# Patient Record
Sex: Male | Born: 1960
Health system: Southern US, Community
[De-identification: ages and names within clinical notes are randomized; demographics above are authoritative.]

## PROBLEM LIST (undated history)

## (undated) HISTORY — PX: KNEE ARTHROSCOPY: SHX127

---

## 2001-06-27 DIAGNOSIS — E7801 Familial hypercholesterolemia: Secondary | ICD-10-CM | POA: Insufficient documentation

## 2007-02-21 DIAGNOSIS — B009 Herpesviral infection, unspecified: Secondary | ICD-10-CM | POA: Insufficient documentation

## 2009-12-03 ENCOUNTER — Ambulatory Visit: Payer: Self-pay | Admitting: Family Medicine

## 2013-12-13 LAB — HM HEPATITIS C SCREENING LAB: HM Hepatitis Screen: NEGATIVE

## 2014-01-31 ENCOUNTER — Ambulatory Visit: Payer: Self-pay | Admitting: Gastroenterology

## 2014-01-31 LAB — HM COLONOSCOPY

## 2015-01-07 ENCOUNTER — Other Ambulatory Visit: Payer: Self-pay | Admitting: Family Medicine

## 2015-01-07 DIAGNOSIS — E785 Hyperlipidemia, unspecified: Secondary | ICD-10-CM | POA: Insufficient documentation

## 2015-01-07 NOTE — Telephone Encounter (Signed)
Last OV 02/2014  Thanks,   -Gawain Crombie  

## 2015-01-21 ENCOUNTER — Telehealth: Payer: Self-pay | Admitting: Family Medicine

## 2016-02-03 NOTE — Telephone Encounter (Signed)
error 

## 2016-08-29 DIAGNOSIS — S83221A Peripheral tear of medial meniscus, current injury, right knee, initial encounter: Secondary | ICD-10-CM | POA: Diagnosis not present

## 2016-08-31 ENCOUNTER — Other Ambulatory Visit: Payer: Self-pay | Admitting: Orthopaedic Surgery

## 2016-08-31 DIAGNOSIS — S83221A Peripheral tear of medial meniscus, current injury, right knee, initial encounter: Secondary | ICD-10-CM

## 2016-08-31 DIAGNOSIS — M25561 Pain in right knee: Secondary | ICD-10-CM

## 2016-09-02 ENCOUNTER — Ambulatory Visit
Admission: RE | Admit: 2016-09-02 | Discharge: 2016-09-02 | Disposition: A | Discharge status: Home / Self Care | Payer: 59 | Source: Ambulatory Visit | Attending: Orthopaedic Surgery | Admitting: Orthopaedic Surgery

## 2016-09-15 ENCOUNTER — Ambulatory Visit
Admission: RE | Admit: 2016-09-15 | Discharge: 2016-09-15 | Disposition: A | Discharge status: Home / Self Care | Payer: Commercial Managed Care - HMO | Source: Ambulatory Visit | Attending: Orthopaedic Surgery | Admitting: Orthopaedic Surgery

## 2016-09-15 DIAGNOSIS — S83241A Other tear of medial meniscus, current injury, right knee, initial encounter: Secondary | ICD-10-CM | POA: Diagnosis not present

## 2016-09-15 DIAGNOSIS — M25561 Pain in right knee: Secondary | ICD-10-CM | POA: Diagnosis not present

## 2016-09-15 DIAGNOSIS — X58XXXA Exposure to other specified factors, initial encounter: Secondary | ICD-10-CM | POA: Insufficient documentation

## 2016-09-15 DIAGNOSIS — R609 Edema, unspecified: Secondary | ICD-10-CM | POA: Insufficient documentation

## 2016-09-15 DIAGNOSIS — S83221A Peripheral tear of medial meniscus, current injury, right knee, initial encounter: Secondary | ICD-10-CM

## 2016-10-03 DIAGNOSIS — M2241 Chondromalacia patellae, right knee: Secondary | ICD-10-CM | POA: Diagnosis not present

## 2016-10-03 DIAGNOSIS — S83221A Peripheral tear of medial meniscus, current injury, right knee, initial encounter: Secondary | ICD-10-CM | POA: Diagnosis not present

## 2016-10-06 DIAGNOSIS — M94261 Chondromalacia, right knee: Secondary | ICD-10-CM | POA: Diagnosis not present

## 2016-10-06 DIAGNOSIS — M65861 Other synovitis and tenosynovitis, right lower leg: Secondary | ICD-10-CM | POA: Diagnosis not present

## 2016-10-06 DIAGNOSIS — S83231A Complex tear of medial meniscus, current injury, right knee, initial encounter: Secondary | ICD-10-CM | POA: Diagnosis not present

## 2016-10-06 DIAGNOSIS — M6588 Other synovitis and tenosynovitis, other site: Secondary | ICD-10-CM | POA: Diagnosis not present

## 2016-10-06 DIAGNOSIS — S83206A Unspecified tear of unspecified meniscus, current injury, right knee, initial encounter: Secondary | ICD-10-CM | POA: Diagnosis not present

## 2016-10-17 DIAGNOSIS — M25561 Pain in right knee: Secondary | ICD-10-CM | POA: Diagnosis not present

## 2016-10-24 DIAGNOSIS — M25561 Pain in right knee: Secondary | ICD-10-CM | POA: Diagnosis not present

## 2016-10-26 DIAGNOSIS — M25561 Pain in right knee: Secondary | ICD-10-CM | POA: Diagnosis not present

## 2017-04-14 ENCOUNTER — Ambulatory Visit: Payer: Self-pay | Admitting: Physician Assistant

## 2017-04-27 ENCOUNTER — Encounter: Payer: Self-pay | Admitting: Physician Assistant

## 2017-04-27 ENCOUNTER — Ambulatory Visit (INDEPENDENT_AMBULATORY_CARE_PROVIDER_SITE_OTHER): Payer: 59 | Admitting: Physician Assistant

## 2017-04-27 VITALS — BP 114/68 | HR 64 | Temp 97.8°F | Resp 16 | Ht 72.0 in | Wt 232.0 lb

## 2017-04-27 DIAGNOSIS — Z1329 Encounter for screening for other suspected endocrine disorder: Secondary | ICD-10-CM

## 2017-04-27 DIAGNOSIS — Z23 Encounter for immunization: Secondary | ICD-10-CM | POA: Diagnosis not present

## 2017-04-27 DIAGNOSIS — Z131 Encounter for screening for diabetes mellitus: Secondary | ICD-10-CM | POA: Diagnosis not present

## 2017-04-27 DIAGNOSIS — S83206A Unspecified tear of unspecified meniscus, current injury, right knee, initial encounter: Secondary | ICD-10-CM | POA: Diagnosis not present

## 2017-04-27 DIAGNOSIS — R1310 Dysphagia, unspecified: Secondary | ICD-10-CM

## 2017-04-27 DIAGNOSIS — D126 Benign neoplasm of colon, unspecified: Secondary | ICD-10-CM

## 2017-04-27 DIAGNOSIS — Z0000 Encounter for general adult medical examination without abnormal findings: Secondary | ICD-10-CM | POA: Diagnosis not present

## 2017-04-27 DIAGNOSIS — R0789 Other chest pain: Secondary | ICD-10-CM | POA: Diagnosis not present

## 2017-04-27 DIAGNOSIS — E785 Hyperlipidemia, unspecified: Secondary | ICD-10-CM

## 2017-04-27 NOTE — Patient Instructions (Signed)

## 2017-04-27 NOTE — Progress Notes (Signed)
Patient: Jose Khan, Male    DOB: Dec 13, 1960, 56 y.o.   MRN: [id number] Visit Date: 04/27/2017  Today's Provider: Trinna Post, PA-C   Chief Complaint  Patient presents with  . Establish Care  . Annual Exam   Subjective:    Annual physical exam Jose Khan is a 56 y.o. male who presents today for health maintenance and complete physical. He feels fairly well. He reports not exercising. He reports he is sleeping fairly well.  Pt says he travels a lot and does not sleep well.   Third marriage, 7 children. Works for [employer] for the past 20 years. Travels a lot.  Previously seen at this clinic, but not for three years. Previously on pravastatin for HLD.   Has recently had right knee surgery for torn meniscus, 8 months ago.  Surgery performed by Dr. Julian Hy at [facility]. He is having pain when he walks and would like it to be looked at by another orthopedist.   He reports he has a pain in his right chest area when he breathes on occasion. No sternal pain, notices it when he moves his arm.   He also reports that he has been having breathlessness with exertion. He used to be an avid runner, running 6-7 miles per day. Since his knee surgery 8 months ago he stopped running. He has noticed these symptoms after stopping running. Denies chest pain.   He also reports occasional trouble swallowing. He says especially when he eats breads or meats, he feels the food moves down slowly and he must drink a lot of water.   -----------------------------------------------------------------   Review of Systems  Constitutional: Negative.   HENT: Negative.   Eyes: Negative.   Respiratory: Negative.   Cardiovascular: Negative.   Gastrointestinal: Negative.   Endocrine: Negative.   Genitourinary: Negative.   Musculoskeletal: Negative.   Skin: Negative.   Allergic/Immunologic: Negative.   Neurological: Negative.   Hematological: Negative.     Psychiatric/Behavioral: Negative.     Social History      He  reports that he has never smoked. He has never used smokeless tobacco. He reports that he drinks about 9.0 oz of alcohol per week . He reports that he does not use drugs.       Social History   Social History  . Marital status: Married    Spouse name: N/A  . Number of children: N/A  . Years of education: N/A   Social History Main Topics  . Smoking status: Never Smoker  . Smokeless tobacco: Never Used  . Alcohol use 9.0 oz/week    7 Glasses of wine, 1 Cans of beer, 7 Shots of liquor per week  . Drug use: No  . Sexual activity: Not Asked   Other Topics Concern  . None   Social History Narrative  . None    No past medical history on file.   Patient Active Problem List   Diagnosis Date Noted  . Hyperlipemia 01/07/2015    Past Surgical History:  Procedure Laterality Date  . KNEE ARTHROSCOPY Right     Family History        Family Status  Relation Status  . Mother Alive  . Father Alive  . MGM (Not Specified)        His family history includes Breast cancer in his mother; CAD in his father; Dementia in his father and maternal grandmother; Macular degeneration in his mother;  Parkinson's disease in his mother.     No Known Allergies  No current outpatient prescriptions on file.   Patient Care Team: Paulene Floor as PCP - General (Physician Assistant)      Objective:   Vitals: BP 114/68 (BP Location: Right Arm, Patient Position: Sitting, Cuff Size: Large)   Pulse 64   Temp 97.8 F (36.6 C) (Oral)   Resp 16   Ht 6' (1.829 m)   Wt 232 lb (105.2 kg)   BMI 31.46 kg/m    Vitals:   04/27/17 0912  BP: 114/68  Pulse: 64  Resp: 16  Temp: 97.8 F (36.6 C)  TempSrc: Oral  Weight: 232 lb (105.2 kg)  Height: 6' (1.829 m)     Physical Exam  Constitutional: He is oriented to person, place, and time. He appears well-nourished.  HENT:  Right Ear: Tympanic membrane and external ear  normal.  Left Ear: Tympanic membrane and external ear normal.  Mouth/Throat: Oropharynx is clear and moist. No oropharyngeal exudate.  Eyes: Conjunctivae are normal.  Neck: Neck supple. Carotid bruit is not present. No thyromegaly present.  Cardiovascular: Normal rate, regular rhythm and normal heart sounds.   Pulmonary/Chest: Effort normal and breath sounds normal. He exhibits no tenderness.  Abdominal: Soft. Bowel sounds are normal.  Lymphadenopathy:    He has no cervical adenopathy.  Neurological: He is alert and oriented to person, place, and time.  Skin: Skin is warm and dry.  Psychiatric: He has a normal mood and affect. His behavior is normal.     Depression Screen PHQ 2/9 Scores 04/27/2017 04/27/2017  PHQ - 2 Score 0 0  PHQ- 9 Score - 1      Assessment & Plan:     Routine Health Maintenance and Physical Exam  Exercise Activities and Dietary recommendations Goals    None      Immunization History  Administered Date(s) Administered  . Influenza,inj,Quad PF,6+ Mos 04/27/2017  . Td 04/27/2017    Health Maintenance  Topic Date Due  . Hepatitis C Screening  1960/12/16  . HIV Screening  09/11/1977  . TETANUS/TDAP  09/11/1981  . COLONOSCOPY  09/11/2012  . INFLUENZA VACCINE  01/25/2017     Discussed health benefits of physical activity, and encouraged him to engage in regular exercise appropriate for his age and condition.    1. Annual physical exam  F/u one year for CPE, 6 mo if we start statin.  - CBC with Differential  2. Tear of meniscus of right knee, unspecified meniscus, unspecified tear type, unspecified whether old or current tear  - Ambulatory referral to Orthopedics  3. Hyperlipidemia, unspecified hyperlipidemia type  Will likely need to start statin again. Pt expresses dissatisfaction with muscle recovery when on statin previously. May try CoQ10.  - Lipid Profile  4. Diabetes mellitus screening  - Comprehensive Metabolic Panel  (CMET)  5. Thyroid disorder screening  - TSH  6. Need for Tdap vaccination   7. Need for Td vaccine  - Td : Tetanus/diphtheria >7yo Preservative  free  8. Flu vaccine need  - Flu Vaccine QUAD 6+ mos PF IM (Fluarix Quad PF)  9. Dysphagia, unspecified type  Possible ring or esophageal web. Would need GI referral if he wants to pursue further. Patient says he will wait right now.   10. Other chest pain  Think this is MSK. Also think breathlessness is due to deconditioning.  11. Tubular adenoma of colon  2015 colonoscopy with two tubular adenomas. Repeat  2020.  No Follow-up on file.  The entirety of the information documented in the History of Present Illness, Review of Systems and Physical Exam were personally obtained by me. Portions of this information were initially documented by Ashley Royalty, CMA and reviewed by me for thoroughness and accuracy.    --------------------------------------------------------------------    Trinna Post, PA-C  [facility]

## 2017-04-28 LAB — CBC WITH DIFFERENTIAL/PLATELET
Basophils Absolute: 22 cells/uL (ref 0–200)
Basophils Relative: 0.5 %
Eosinophils Absolute: 79 cells/uL (ref 15–500)
Eosinophils Relative: 1.8 %
HCT: 42 % (ref 38.5–50.0)
Hemoglobin: 15 g/dL (ref 13.2–17.1)
Lymphs Abs: 1492 cells/uL (ref 850–3900)
MCH: 32.5 pg (ref 27.0–33.0)
MCHC: 35.7 g/dL (ref 32.0–36.0)
MCV: 90.9 fL (ref 80.0–100.0)
MPV: 11.4 fL (ref 7.5–12.5)
Monocytes Relative: 10.6 %
Neutro Abs: 2341 cells/uL (ref 1500–7800)
Neutrophils Relative %: 53.2 %
Platelets: 123 10*3/uL — ABNORMAL LOW (ref 140–400)
RBC: 4.62 10*6/uL (ref 4.20–5.80)
RDW: 12.6 % (ref 11.0–15.0)
Total Lymphocyte: 33.9 %
WBC mixed population: 466 cells/uL (ref 200–950)
WBC: 4.4 10*3/uL (ref 3.8–10.8)

## 2017-04-28 LAB — COMPLETE METABOLIC PANEL WITH GFR
AG Ratio: 1.7 (calc) (ref 1.0–2.5)
ALT: 42 U/L (ref 9–46)
AST: 26 U/L (ref 10–35)
Albumin: 4.4 g/dL (ref 3.6–5.1)
Alkaline phosphatase (APISO): 59 U/L (ref 40–115)
BUN: 13 mg/dL (ref 7–25)
CO2: 27 mmol/L (ref 20–32)
Calcium: 9.3 mg/dL (ref 8.6–10.3)
Chloride: 103 mmol/L (ref 98–110)
Creat: 1.1 mg/dL (ref 0.70–1.33)
GFR, Est African American: 88 mL/min/{1.73_m2} (ref >=60)
GFR, Est Non African American: 76 mL/min/{1.73_m2} (ref >=60)
Globulin: 2.6 g/dL (calc) (ref 1.9–3.7)
Glucose, Bld: 97 mg/dL (ref 65–99)
Potassium: 4.1 mmol/L (ref 3.5–5.3)
Sodium: 139 mmol/L (ref 135–146)
Total Bilirubin: 1 mg/dL (ref 0.2–1.2)
Total Protein: 7 g/dL (ref 6.1–8.1)

## 2017-04-28 LAB — LIPID PANEL
Cholesterol: 306 mg/dL — ABNORMAL HIGH (ref <200)
HDL: 47 mg/dL (ref >40)
LDL Cholesterol (Calc): 223 mg/dL (calc) — ABNORMAL HIGH
Non-HDL Cholesterol (Calc): 259 mg/dL (calc) — ABNORMAL HIGH (ref <130)
Total CHOL/HDL Ratio: 6.5 (calc) — ABNORMAL HIGH (ref <5.0)
Triglycerides: 184 mg/dL — ABNORMAL HIGH (ref <150)

## 2017-04-28 LAB — TSH: TSH: 2.85 mIU/L (ref 0.40–4.50)

## 2017-05-03 ENCOUNTER — Telehealth: Payer: Self-pay

## 2017-05-03 MED ORDER — ATORVASTATIN CALCIUM 10 MG PO TABS: 10.0000 mg | ORAL_TABLET | Freq: Every day | ORAL | 1 refills | Status: DC

## 2017-05-03 NOTE — Telephone Encounter (Signed)
Pt advised.   Thanks,   -Charlesetta Milliron  

## 2017-05-03 NOTE — Addendum Note (Signed)
Addended by: Trinna Post on: 05/03/2017 08:45 AM   Modules accepted: Orders

## 2017-05-03 NOTE — Telephone Encounter (Signed)
-----   Message from Trinna Post, [place] sent at 05/03/2017  8:48 AM EST ----- Sent in 10 mg atorvatatin. Take one pill nightly. May take with 100 mg CoQ10 daily to help with side effects. Follow up in office in 6 mo for visit.

## 2017-05-09 DIAGNOSIS — M25561 Pain in right knee: Secondary | ICD-10-CM | POA: Diagnosis not present

## 2017-05-09 DIAGNOSIS — G8929 Other chronic pain: Secondary | ICD-10-CM | POA: Diagnosis not present

## 2017-07-05 DIAGNOSIS — L821 Other seborrheic keratosis: Secondary | ICD-10-CM | POA: Diagnosis not present

## 2017-08-10 ENCOUNTER — Other Ambulatory Visit: Payer: Self-pay | Admitting: Physician Assistant

## 2017-08-10 DIAGNOSIS — E785 Hyperlipidemia, unspecified: Secondary | ICD-10-CM

## 2017-08-15 NOTE — Telephone Encounter (Signed)
Please have patient set up follow up appointment in May for cholesterol medication check and labs.

## 2017-11-07 DIAGNOSIS — M25561 Pain in right knee: Secondary | ICD-10-CM | POA: Diagnosis not present

## 2017-11-07 DIAGNOSIS — M1711 Unilateral primary osteoarthritis, right knee: Secondary | ICD-10-CM | POA: Diagnosis not present

## 2017-12-22 ENCOUNTER — Other Ambulatory Visit: Payer: Self-pay | Admitting: Physician Assistant

## 2017-12-22 DIAGNOSIS — E785 Hyperlipidemia, unspecified: Secondary | ICD-10-CM

## 2017-12-25 NOTE — Telephone Encounter (Signed)
Please schedule follow up OV for lipid panel to check on Lipitor. Try to be fasting ideally.

## 2018-04-17 ENCOUNTER — Encounter: Payer: Self-pay | Admitting: Physician Assistant

## 2018-04-17 ENCOUNTER — Ambulatory Visit (INDEPENDENT_AMBULATORY_CARE_PROVIDER_SITE_OTHER): Payer: 59 | Admitting: Physician Assistant

## 2018-04-17 VITALS — BP 136/106 | HR 58 | Temp 97.8°F | Resp 16 | Ht 72.0 in | Wt 227.0 lb

## 2018-04-17 DIAGNOSIS — Z125 Encounter for screening for malignant neoplasm of prostate: Secondary | ICD-10-CM

## 2018-04-17 DIAGNOSIS — Z1159 Encounter for screening for other viral diseases: Secondary | ICD-10-CM

## 2018-04-17 DIAGNOSIS — Z1329 Encounter for screening for other suspected endocrine disorder: Secondary | ICD-10-CM | POA: Diagnosis not present

## 2018-04-17 DIAGNOSIS — E785 Hyperlipidemia, unspecified: Secondary | ICD-10-CM | POA: Diagnosis not present

## 2018-04-17 DIAGNOSIS — R1310 Dysphagia, unspecified: Secondary | ICD-10-CM | POA: Diagnosis not present

## 2018-04-17 DIAGNOSIS — Z0000 Encounter for general adult medical examination without abnormal findings: Secondary | ICD-10-CM | POA: Diagnosis not present

## 2018-04-17 DIAGNOSIS — Z23 Encounter for immunization: Secondary | ICD-10-CM

## 2018-04-17 DIAGNOSIS — D369 Benign neoplasm, unspecified site: Secondary | ICD-10-CM

## 2018-04-17 DIAGNOSIS — Z114 Encounter for screening for human immunodeficiency virus [HIV]: Secondary | ICD-10-CM

## 2018-04-17 NOTE — Patient Instructions (Signed)

## 2018-04-17 NOTE — Progress Notes (Signed)
Patient: Jose Khan, Male    DOB: 1960/09/14, 57 y.o.   MRN: [id number] Visit Date: 04/17/2018  Today's Provider: Trinna Post, PA-C   Chief Complaint  Patient presents with  . Annual Exam   Subjective:    Annual physical exam Jose Khan is a 57 y.o. male who presents today for health maintenance and complete physical. He feels well. He reports exercising none. He reports he is sleeping well.  Colonoscopy: 2015 with tubular adenoma, repeat 2020  Patient c/o swallowing problems, patient reports he has to take small bites and chew his food well. Patient reports taking Tums and reports good symptom control. He reported this issue at last years physical as well.   HLD: Patient was started on Lipitor at last visit.   Lipid Panel     Component Value Date/Time   CHOL 238 (H) 04/17/2018 1118   TRIG 113 04/17/2018 1118   HDL 56 04/17/2018 1118   CHOLHDL 6.5 (H) 04/28/2017 0808   LDLCALC 159 (H) 04/17/2018 1118   LDLCALC 223 (H) 04/28/2017 0808     Wt Readings from Last 3 Encounters:  04/17/18 227 lb (103 kg)  04/27/17 232 lb (105.2 kg)   BP Readings from Last 3 Encounters:  04/17/18 (!) 136/106  04/27/17 114/68    -----------------------------------------------------------------   Review of Systems  Constitutional: Negative.   HENT: Positive for trouble swallowing.   Eyes: Negative.   Respiratory: Negative.   Cardiovascular: Negative.   Gastrointestinal: Negative.   Endocrine: Negative.   Genitourinary: Negative.   Musculoskeletal: Negative.   Skin: Negative.   Allergic/Immunologic: Negative.   Neurological: Negative.   Hematological: Negative.   Psychiatric/Behavioral: Negative.     Social History      He  reports that he has never smoked. He has never used smokeless tobacco. He reports that he drinks about 15.0 standard drinks of alcohol per week. He reports that he does not use drugs.       Social History   Socioeconomic History  .  Marital status: Married    Spouse name: Not on file  . Number of children: Not on file  . Years of education: Not on file  . Highest education level: Not on file  Occupational History  . Not on file  Social Needs  . Financial resource strain: Not on file  . Food insecurity:    Worry: Not on file    Inability: Not on file  . Transportation needs:    Medical: Not on file    Non-medical: Not on file  Tobacco Use  . Smoking status: Never Smoker  . Smokeless tobacco: Never Used  Substance and Sexual Activity  . Alcohol use: Yes    Alcohol/week: 15.0 standard drinks    Types: 7 Glasses of wine, 1 Cans of beer, 7 Shots of liquor per week  . Drug use: No  . Sexual activity: Not on file  Lifestyle  . Physical activity:    Days per week: Not on file    Minutes per session: Not on file  . Stress: Not on file  Relationships  . Social connections:    Talks on phone: Not on file    Gets together: Not on file    Attends religious service: Not on file    Active member of club or organization: Not on file    Attends meetings of clubs or organizations: Not on file    Relationship status: Not on file  Other Topics Concern  . Not on file  Social History Narrative  . Not on file    No past medical history on file.   Patient Active Problem List   Diagnosis Date Noted  . Tubular adenoma of colon 04/27/2017  . Dysphagia 04/27/2017  . Tear of meniscus of right knee 04/27/2017  . Hyperlipemia 01/07/2015    Past Surgical History:  Procedure Laterality Date  . KNEE ARTHROSCOPY Right     Family History        Family Status  Relation Name Status  . Mother  Alive  . Father  Alive  . MGM  (Not Specified)        His family history includes Breast cancer in his mother; CAD in his father; Dementia in his father and maternal grandmother; Macular degeneration in his mother; Parkinson's disease in his mother.      No Known Allergies   Current Outpatient Medications:  .   atorvastatin (LIPITOR) 10 MG tablet, TAKE ONE TABLET BY MOUTH EVERY DAY, Disp: 30 tablet, Rfl: 0   Patient Care Team: Paulene Floor as PCP - General (Physician Assistant)      Objective:   Vitals: BP (!) 136/106 (BP Location: Left Arm, Patient Position: Sitting, Cuff Size: Large)   Pulse (!) 58   Temp 97.8 F (36.6 C) (Oral)   Resp 16   Ht 6' (1.829 m)   Wt 227 lb (103 kg)   SpO2 97%   BMI 30.79 kg/m    Vitals:   04/17/18 1026  BP: (!) 136/106  Pulse: (!) 58  Resp: 16  Temp: 97.8 F (36.6 C)  TempSrc: Oral  SpO2: 97%  Weight: 227 lb (103 kg)  Height: 6' (1.829 m)     Physical Exam  Constitutional: He is oriented to person, place, and time. He appears well-developed and well-nourished.  Cardiovascular: Normal rate and regular rhythm.  Pulmonary/Chest: Effort normal and breath sounds normal.  Abdominal: Soft. Bowel sounds are normal.  Neurological: He is alert and oriented to person, place, and time.  Skin: Skin is warm and dry.  Psychiatric: He has a normal mood and affect. His behavior is normal.     Depression Screen PHQ 2/9 Scores 04/17/2018 04/27/2017 04/27/2017  PHQ - 2 Score 0 0 0  PHQ- 9 Score 1 - 1      Assessment & Plan:     Routine Health Maintenance and Physical Exam  Exercise Activities and Dietary recommendations Goals   None     Immunization History  Administered Date(s) Administered  . Hepatitis A, Adult 08/25/2010  . Influenza,inj,Quad PF,6+ Mos 04/27/2017  . Measles 02/18/1988  . Rubella 02/18/1988  . Td 04/27/2017  . Tdap 02/21/2007    Health Maintenance  Topic Date Due  . Hepatitis C Screening  03-Aug-1960  . HIV Screening  09/11/1977  . COLONOSCOPY  09/11/2012  . INFLUENZA VACCINE  01/25/2018  . TETANUS/TDAP  04/28/2027     Discussed health benefits of physical activity, and encouraged him to engage in regular exercise appropriate for his age and condition.    1. Annual physical exam  - CBC with  Differential/Platelet - Comprehensive metabolic panel  2. Need for influenza vaccination  - Flu Vaccine QUAD 36+ mos IM  3. Thyroid disorder screening  - TSH  4. Dysphagia, unspecified type  Refer to GI, ongoing x 1 year. Also due for colonoscopy, if needed might be able to get colonoscopy and EGD at the same time.   -  Ambulatory referral to Gastroenterology  5. Hyperlipidemia, unspecified hyperlipidemia type  Change lipitor to crestor due to elevated liver enzymes. Follow up labs 6 weeks.   - Lipid Panel With LDL/HDL Ratio  6. Prostate cancer screening  - PSA  7. Need for hepatitis C screening test   8. Encounter for screening for HIV  - HIV Antibody (routine testing w rflx)  9. Tubular adenoma  - Ambulatory referral to Gastroenterology  --------------------------------------------------------------------    Trinna Post, PA-C  [place]

## 2018-04-18 LAB — COMPREHENSIVE METABOLIC PANEL
ALT: 71 IU/L — ABNORMAL HIGH (ref 0–44)
AST: 42 IU/L — ABNORMAL HIGH (ref 0–40)
Albumin/Globulin Ratio: 2 (ref 1.2–2.2)
Albumin: 4.8 g/dL (ref 3.5–5.5)
Alkaline Phosphatase: 73 IU/L (ref 39–117)
BUN/Creatinine Ratio: 12 (ref 9–20)
BUN: 13 mg/dL (ref 6–24)
Bilirubin Total: 0.7 mg/dL (ref 0.0–1.2)
CO2: 26 mmol/L (ref 20–29)
Calcium: 10 mg/dL (ref 8.7–10.2)
Chloride: 103 mmol/L (ref 96–106)
Creatinine, Ser: 1.13 mg/dL (ref 0.76–1.27)
GFR calc Af Amer: 84 mL/min/{1.73_m2} (ref >59)
GFR calc non Af Amer: 73 mL/min/{1.73_m2} (ref >59)
Globulin, Total: 2.4 g/dL (ref 1.5–4.5)
Glucose: 97 mg/dL (ref 65–99)
Potassium: 4.3 mmol/L (ref 3.5–5.2)
Sodium: 145 mmol/L — ABNORMAL HIGH (ref 134–144)
Total Protein: 7.2 g/dL (ref 6.0–8.5)

## 2018-04-18 LAB — CBC WITH DIFFERENTIAL/PLATELET
Basophils Absolute: 0 10*3/uL (ref 0.0–0.2)
Basos: 1 %
EOS (ABSOLUTE): 0.1 10*3/uL (ref 0.0–0.4)
Eos: 2 %
Hematocrit: 44.9 % (ref 37.5–51.0)
Hemoglobin: 15.5 g/dL (ref 13.0–17.7)
Immature Grans (Abs): 0 10*3/uL (ref 0.0–0.1)
Immature Granulocytes: 0 %
Lymphocytes Absolute: 1.8 10*3/uL (ref 0.7–3.1)
Lymphs: 41 %
MCH: 32.1 pg (ref 26.6–33.0)
MCHC: 34.5 g/dL (ref 31.5–35.7)
MCV: 93 fL (ref 79–97)
Monocytes Absolute: 0.5 10*3/uL (ref 0.1–0.9)
Monocytes: 11 %
Neutrophils Absolute: 2 10*3/uL (ref 1.4–7.0)
Neutrophils: 45 %
Platelets: 153 10*3/uL (ref 150–450)
RBC: 4.83 x10E6/uL (ref 4.14–5.80)
RDW: 12.3 % (ref 12.3–15.4)
WBC: 4.3 10*3/uL (ref 3.4–10.8)

## 2018-04-18 LAB — LIPID PANEL WITH LDL/HDL RATIO
Cholesterol, Total: 238 mg/dL — ABNORMAL HIGH (ref 100–199)
HDL: 56 mg/dL (ref >39)
LDL Calculated: 159 mg/dL — ABNORMAL HIGH (ref 0–99)
LDl/HDL Ratio: 2.8 ratio (ref 0.0–3.6)
Triglycerides: 113 mg/dL (ref 0–149)
VLDL Cholesterol Cal: 23 mg/dL (ref 5–40)

## 2018-04-18 LAB — PSA: Prostate Specific Ag, Serum: 0.6 ng/mL (ref 0.0–4.0)

## 2018-04-18 LAB — HIV ANTIBODY (ROUTINE TESTING W REFLEX): HIV Screen 4th Generation wRfx: NONREACTIVE

## 2018-04-18 LAB — TSH: TSH: 2.3 u[IU]/mL (ref 0.450–4.500)

## 2018-04-19 ENCOUNTER — Telehealth: Payer: Self-pay | Admitting: Physician Assistant

## 2018-04-19 NOTE — Telephone Encounter (Signed)
Can we call this patient? He was scheduled for physical on 04/17/2018 but his last CPE ws 04/27/2018. Does his insurance pay for CPE every 365 days or every calendar year? I need to know how to bill it.

## 2018-04-23 ENCOUNTER — Telehealth: Payer: Self-pay

## 2018-04-23 MED ORDER — ROSUVASTATIN CALCIUM 10 MG PO TABS: 10.0000 mg | ORAL_TABLET | Freq: Every day | ORAL | 1 refills | Status: DC

## 2018-04-23 NOTE — Telephone Encounter (Signed)
lmtcb

## 2018-04-23 NOTE — Telephone Encounter (Signed)
-----   Message from Trinna Post, [place] sent at 04/20/2018  1:53 PM EDT ----- CBC normal. CMET shows a slight increase in liver enzymes. Cholesterol has improved, LDL down from 220 to 159. HIV negative, TSH and prostate specific antigen normal. Increased liver enzymes may be due to the cholesterol medication. I would recommend switching to crestor and having him follow up in the office in 6 weeks. Also recommend reducing any tylenol usage and alcohol usage.

## 2018-04-23 NOTE — Telephone Encounter (Signed)
Patient advised as below. Patient verbalizes understanding and is in agreement with treatment plan.  

## 2018-04-25 ENCOUNTER — Encounter: Payer: Self-pay | Admitting: Physician Assistant

## 2018-05-23 ENCOUNTER — Encounter: Payer: Self-pay | Admitting: Physician Assistant

## 2018-05-23 DIAGNOSIS — Z23 Encounter for immunization: Secondary | ICD-10-CM

## 2018-05-23 DIAGNOSIS — Z7184 Encounter for health counseling related to travel: Secondary | ICD-10-CM

## 2018-05-29 ENCOUNTER — Ambulatory Visit: Payer: 59 | Admitting: Gastroenterology

## 2018-05-29 ENCOUNTER — Encounter: Payer: Self-pay | Admitting: Gastroenterology

## 2018-05-29 ENCOUNTER — Other Ambulatory Visit: Payer: Self-pay

## 2018-05-29 VITALS — BP 115/79 | HR 70 | Ht 72.0 in | Wt 233.4 lb

## 2018-05-29 DIAGNOSIS — J069 Acute upper respiratory infection, unspecified: Secondary | ICD-10-CM | POA: Insufficient documentation

## 2018-05-29 DIAGNOSIS — R748 Abnormal levels of other serum enzymes: Secondary | ICD-10-CM | POA: Diagnosis not present

## 2018-05-29 DIAGNOSIS — R4702 Dysphasia: Secondary | ICD-10-CM | POA: Diagnosis not present

## 2018-05-29 MED ORDER — TYPHOID VACCINE PO CPDR: 1.0000 | DELAYED_RELEASE_CAPSULE | ORAL | 0 refills | Status: DC

## 2018-05-29 NOTE — Patient Instructions (Signed)
You are scheduled for a RUQ abdominal US at [facility] on Friday, Dec 6th at 8:30am. Please arrive at the medical mall registration desk at 8:15am. You cannot have anything to eat or drink after midnight on Thursday night.  If you need to reschedule this appointment for any reason, please contact central scheduling at [phone].

## 2018-05-29 NOTE — Progress Notes (Signed)
Primary Care Physician: Paulene Floor  Primary Gastroenterologist:  Dr. Lucilla Lame  Chief Complaint  Patient presents with  . Dysphagia    HPI: Jose Khan is a 57 y.o. male here with a history of tubular adenomas of the last colonoscopy.  The patient now comes in for dysphasia. The patient was recommended to have a repeat colonoscopy in 5 years after his last colonoscopy.  The patient denies any unexplained weight loss black stools or bloody stool.  He reports that most of his dysphasia is to rice and pills.  He also reports that his dysphasia has come at a time when he has had some increase in his heartburn. On further investigation of his chart he was noted to have increased liver enzymes.  The patient denies having a hepatitis C virus antibody checked in the past and also reports that he drinks about 4 drinks per day and has done this for some time.  Current Outpatient Medications  Medication Sig Dispense Refill  . rosuvastatin (CRESTOR) 10 MG tablet Take 1 tablet (10 mg total) by mouth daily. 30 tablet 1   No current facility-administered medications for this visit.     Allergies as of 05/29/2018  . (No Known Allergies)    ROS:  General: Negative for anorexia, weight loss, fever, chills, fatigue, weakness. ENT: Negative for hoarseness, difficulty swallowing , nasal congestion. CV: Negative for chest pain, angina, palpitations, dyspnea on exertion, peripheral edema.  Respiratory: Negative for dyspnea at rest, dyspnea on exertion, cough, sputum, wheezing.  GI: See history of present illness. GU:  Negative for dysuria, hematuria, urinary incontinence, urinary frequency, nocturnal urination.  Endo: Negative for unusual weight change.    Physical Examination:   BP 115/79   Pulse 70   Ht 6' (1.829 m)   Wt 233 lb 6.4 oz (105.9 kg)   BMI 31.65 kg/m   General: Well-nourished, well-developed in no acute distress.  Eyes: No icterus. Conjunctivae pink. Mouth:  Oropharyngeal mucosa moist and pink , no lesions erythema or exudate. Lungs: Clear to auscultation bilaterally. Non-labored. Heart: Regular rate and rhythm, no murmurs rubs or gallops.  Abdomen: Bowel sounds are normal, nontender, nondistended, no hepatosplenomegaly or masses, no abdominal bruits or hernia , no rebound or guarding.   Extremities: No lower extremity edema. No clubbing or deformities.  There are contractures on his hands bilaterally Neuro: Alert and oriented x 3.  Grossly intact. Skin: Warm and dry, no jaundice.   Psych: Alert and cooperative, normal mood and affect.  Labs:    Imaging Studies: No results found.  Assessment and Plan:   Jose Khan is a 57 y.o. y/o male who has some dysphasia to rice and pills.  The patient will be set up for an EGD.  The patient will also be started on a sample of Dexilant to see if his symptoms improve.  He is also due for colonoscopy due to his history of adenomatous polyps.  The patient will also be set up for a colonoscopy at the same time as his EGD. I have discussed risks & benefits which include, but are not limited to, bleeding, infection, perforation & drug reaction.  The patient agrees with this plan & written consent will be obtained.    The patient will also have his labs sent for possible causes of his abnormal liver enzymes.  The patient will also be set up for a right upper quadrant ultrasound and has been told to decrease his alcohol  intake.   Lucilla Lame, MD. [facility]   Note: This dictation was prepared with Dragon dictation along with smaller phrase technology. Any transcriptional errors that result from this process are unintentional.

## 2018-05-30 LAB — ANTI-SMOOTH MUSCLE ANTIBODY, IGG: Smooth Muscle Ab: 7 Units (ref 0–19)

## 2018-05-30 LAB — HEPATITIS C ANTIBODY: Hep C Virus Ab: <0.1 s/co ratio (ref 0.0–0.9)

## 2018-05-30 LAB — ANA: Anti Nuclear Antibody(ANA): NEGATIVE

## 2018-05-30 LAB — HEPATIC FUNCTION PANEL
ALT: 74 IU/L — AB (ref 0–44)
AST: 44 IU/L — ABNORMAL HIGH (ref 0–40)
Albumin: 4.9 g/dL (ref 3.5–5.5)
Alkaline Phosphatase: 70 IU/L (ref 39–117)
BILIRUBIN, DIRECT: 0.18 mg/dL (ref 0.00–0.40)
Bilirubin Total: 0.7 mg/dL (ref 0.0–1.2)
Total Protein: 7.4 g/dL (ref 6.0–8.5)

## 2018-05-30 LAB — IRON AND TIBC
Iron Saturation: 38 % (ref 15–55)
Iron: 114 ug/dL (ref 38–169)
TIBC: 301 ug/dL (ref 250–450)
UIBC: 187 ug/dL (ref 111–343)

## 2018-05-30 LAB — HEPATITIS B SURFACE ANTIBODY,QUALITATIVE: HEP B SURFACE AB, QUAL: NONREACTIVE

## 2018-05-30 LAB — CERULOPLASMIN: Ceruloplasmin: 20.7 mg/dL (ref 16.0–31.0)

## 2018-05-30 LAB — HEPATITIS A ANTIBODY, TOTAL: Hep A Total Ab: POSITIVE — AB

## 2018-05-30 LAB — MITOCHONDRIAL ANTIBODIES: Mitochondrial Ab: <20 Units (ref 0.0–20.0)

## 2018-05-30 LAB — ALPHA-1-ANTITRYPSIN: A-1 Antitrypsin: 109 mg/dL (ref 101–187)

## 2018-05-30 LAB — HEPATITIS B SURFACE ANTIGEN: Hepatitis B Surface Ag: NEGATIVE

## 2018-05-30 LAB — FERRITIN: FERRITIN: 654 ng/mL — AB (ref 30–400)

## 2018-05-31 ENCOUNTER — Other Ambulatory Visit: Payer: Self-pay

## 2018-05-31 DIAGNOSIS — Z86010 Personal history of colonic polyps: Secondary | ICD-10-CM

## 2018-05-31 DIAGNOSIS — R748 Abnormal levels of other serum enzymes: Secondary | ICD-10-CM

## 2018-06-01 ENCOUNTER — Ambulatory Visit
Admission: RE | Admit: 2018-06-01 | Discharge: 2018-06-01 | Disposition: A | Discharge status: Home / Self Care | Payer: 59 | Source: Ambulatory Visit | Attending: Gastroenterology | Admitting: Gastroenterology

## 2018-06-01 DIAGNOSIS — R748 Abnormal levels of other serum enzymes: Secondary | ICD-10-CM | POA: Diagnosis not present

## 2018-06-01 DIAGNOSIS — K760 Fatty (change of) liver, not elsewhere classified: Secondary | ICD-10-CM | POA: Insufficient documentation

## 2018-06-04 ENCOUNTER — Telehealth: Payer: Self-pay

## 2018-06-04 NOTE — Telephone Encounter (Signed)
Pt notified of labs and US results.  

## 2018-06-04 NOTE — Telephone Encounter (Signed)
-----   Message from Lucilla Lame, MD sent at 06/04/2018 11:31 AM EST ----- Let the patient know the labs are still elevated. He needs a Hep B vaccine and need to stop drinking before having the labs rechecked. His U/S showers fatty liver likely from drinking.

## 2018-06-07 ENCOUNTER — Other Ambulatory Visit: Payer: Self-pay

## 2018-06-07 ENCOUNTER — Ambulatory Visit: Payer: 59 | Admitting: Physician Assistant

## 2018-06-07 ENCOUNTER — Encounter: Payer: Self-pay | Admitting: Physician Assistant

## 2018-06-07 ENCOUNTER — Telehealth: Payer: Self-pay | Admitting: Gastroenterology

## 2018-06-07 VITALS — BP 118/81 | HR 64 | Temp 97.8°F | Wt 234.0 lb

## 2018-06-07 DIAGNOSIS — Z23 Encounter for immunization: Secondary | ICD-10-CM | POA: Diagnosis not present

## 2018-06-07 DIAGNOSIS — K760 Fatty (change of) liver, not elsewhere classified: Secondary | ICD-10-CM

## 2018-06-07 DIAGNOSIS — R4702 Dysphasia: Secondary | ICD-10-CM

## 2018-06-07 DIAGNOSIS — R748 Abnormal levels of other serum enzymes: Secondary | ICD-10-CM

## 2018-06-07 DIAGNOSIS — E785 Hyperlipidemia, unspecified: Secondary | ICD-10-CM | POA: Diagnosis not present

## 2018-06-07 DIAGNOSIS — Z86010 Personal history of colonic polyps: Secondary | ICD-10-CM

## 2018-06-07 NOTE — Telephone Encounter (Signed)
Pt left vm he  would like a call regarding his procedure 12/17

## 2018-06-07 NOTE — Telephone Encounter (Signed)
Pt rescheduled colonoscopy/EGD to 07/03/18 due to insurance.

## 2018-06-07 NOTE — Progress Notes (Signed)
Patient: Jose Khan Male    DOB: [birth year]   57 y.o.   MRN: [id number] Visit Date: 06/08/2018  Today's Provider: Trinna Post, PA-C   Chief Complaint  Patient presents with  . Hyperlipidemia   Subjective:    HPI  Lipid/Cholesterol, Follow-up:   Last seen for this 2 months ago.  Management changes since that visit include changing from lipitor to crestor 10 mg. .  Last Lipid Panel:    Component Value Date/Time   CHOL 293 (H) 06/07/2018 0849   TRIG 164 (H) 06/07/2018 0849   HDL 45 06/07/2018 0849   CHOLHDL 6.5 (H) 06/07/2018 0849   CHOLHDL 6.5 (H) 04/28/2017 0808   LDLCALC 215 (H) 06/07/2018 0849   LDLCALC 223 (H) 04/28/2017 3818    Risk factors for vascular disease include hypercholesterolemia  He reports good compliance with treatment. He is not having side effects.  Current symptoms include none and have been stable. Weight trend: stable Prior visit with dietician: no Current diet: well balanced Current exercise: running/ jogging and walking  Wt Readings from Last 3 Encounters:  06/07/18 234 lb (106.1 kg)  05/29/18 233 lb 6.4 oz (105.9 kg)  04/17/18 227 lb (103 kg)   Elevated Liver Enzymes: Has seen GI and workup largely negative with impression that enzymes elevated 2/2 alcohol use. Instructed to stop using alcohol and recheck. [place] with fatty liver. Patient reports reducing alcohol intake. He needs Hep B vaccine.   -------------------------------------------------------------------     No Known Allergies   Current Outpatient Medications:  .  typhoid (VIVOTIF) DR capsule, Take 1 capsule by mouth every other day., Disp: 4 capsule, Rfl: 0 .  rosuvastatin (CRESTOR) 20 MG tablet, Take 1 tablet (20 mg total) by mouth daily., Disp: 90 tablet, Rfl: 0  Review of Systems  Constitutional: Negative.   HENT: Negative.   Respiratory: Negative.   Gastrointestinal: Negative.   Genitourinary: Negative.   Neurological: Negative.     Social  History   Tobacco Use  . Smoking status: Never Smoker  . Smokeless tobacco: Never Used  Substance Use Topics  . Alcohol use: Yes    Alcohol/week: 13.0 standard drinks    Types: 7 Glasses of wine, 1 Cans of beer, 5 Shots of liquor per week   Objective:   BP 118/81 (BP Location: Left Arm, Patient Position: Sitting, Cuff Size: Large)   Pulse 64   Temp 97.8 F (36.6 C) (Oral)   Wt 234 lb (106.1 kg)   SpO2 98%   BMI 31.74 kg/m  Vitals:   06/07/18 0813  BP: 118/81  Pulse: 64  Temp: 97.8 F (36.6 C)  TempSrc: Oral  SpO2: 98%  Weight: 234 lb (106.1 kg)     Physical Exam Cardiovascular:     Rate and Rhythm: Normal rate and regular rhythm.     Heart sounds: Normal heart sounds.  Pulmonary:     Effort: Pulmonary effort is normal.     Breath sounds: Normal breath sounds.  Skin:    General: Skin is warm and dry.  Neurological:     Mental Status: He is oriented to person, place, and time. Mental status is at baseline.  Psychiatric:        Mood and Affect: Mood normal.        Behavior: Behavior normal.         Assessment & Plan:     1. Hyperlipidemia, unspecified hyperlipidemia type  Cholesterol uncontrolled, increase to 20 mg  crestor and follow up in office 1 mo.  - Lipid Profile  2. Need for hepatitis B vaccination  Start vaccine today for fatty liver per GI recommendations.  - Hepatitis B vaccine adult IM  3. Fatty liver  Decrease alcohol and lose weight.  Return in about 1 month (around 07/08/2018) for Hep B vaccine, lipids.  The entirety of the information documented in the History of Present Illness, Review of Systems and Physical Exam were personally obtained by me. Portions of this information were initially documented by Hurman Horn, CMA and reviewed by me for thoroughness and accuracy.             Trinna Post, PA-C  [facility]

## 2018-06-08 ENCOUNTER — Telehealth: Payer: Self-pay

## 2018-06-08 DIAGNOSIS — E785 Hyperlipidemia, unspecified: Secondary | ICD-10-CM

## 2018-06-08 LAB — LIPID PANEL
Chol/HDL Ratio: 6.5 ratio — ABNORMAL HIGH (ref 0.0–5.0)
Cholesterol, Total: 293 mg/dL — ABNORMAL HIGH (ref 100–199)
HDL: 45 mg/dL (ref >39)
LDL Calculated: 215 mg/dL — ABNORMAL HIGH (ref 0–99)
Triglycerides: 164 mg/dL — ABNORMAL HIGH (ref 0–149)
VLDL Cholesterol Cal: 33 mg/dL (ref 5–40)

## 2018-06-08 MED ORDER — ROSUVASTATIN CALCIUM 20 MG PO TABS: 20.0000 mg | ORAL_TABLET | Freq: Every day | ORAL | 0 refills | Status: DC

## 2018-06-08 NOTE — Telephone Encounter (Signed)
-----   Message from Trinna Post, [place] sent at 06/08/2018  8:43 AM EST ----- Cholesterol has increased. Recommend increasing to 20 mg crestor and focusing on weight loss. Can we switch his one month vaccine visit to a follow up to see him back and get some labs?

## 2018-06-08 NOTE — Telephone Encounter (Signed)
Patient advised as directed below. Per patient he needs a Rx for Crestor to be send to [facility].

## 2018-06-08 NOTE — Telephone Encounter (Signed)
Sent in 20 mg crestor.

## 2018-06-13 DIAGNOSIS — E785 Hyperlipidemia, unspecified: Secondary | ICD-10-CM | POA: Diagnosis not present

## 2018-06-13 DIAGNOSIS — Z23 Encounter for immunization: Secondary | ICD-10-CM | POA: Diagnosis not present

## 2018-06-13 DIAGNOSIS — K760 Fatty (change of) liver, not elsewhere classified: Secondary | ICD-10-CM | POA: Diagnosis not present

## 2018-07-02 ENCOUNTER — Encounter: Payer: Self-pay | Admitting: Student

## 2018-07-03 ENCOUNTER — Ambulatory Visit: Payer: BLUE CROSS/BLUE SHIELD | Admitting: Anesthesiology

## 2018-07-03 ENCOUNTER — Ambulatory Visit
Admission: RE | Admit: 2018-07-03 | Discharge: 2018-07-03 | Disposition: A | Discharge status: Home / Self Care | Payer: BLUE CROSS/BLUE SHIELD | Attending: Gastroenterology | Admitting: Gastroenterology

## 2018-07-03 ENCOUNTER — Encounter
Admission: RE | Disposition: A | Discharge status: Home / Self Care | Payer: Self-pay | Source: Home / Self Care | Attending: Gastroenterology

## 2018-07-03 DIAGNOSIS — Z1211 Encounter for screening for malignant neoplasm of colon: Secondary | ICD-10-CM | POA: Insufficient documentation

## 2018-07-03 DIAGNOSIS — Z86010 Personal history of colonic polyps: Secondary | ICD-10-CM | POA: Diagnosis not present

## 2018-07-03 DIAGNOSIS — K2980 Duodenitis without bleeding: Secondary | ICD-10-CM | POA: Diagnosis not present

## 2018-07-03 DIAGNOSIS — K635 Polyp of colon: Secondary | ICD-10-CM | POA: Diagnosis not present

## 2018-07-03 DIAGNOSIS — D125 Benign neoplasm of sigmoid colon: Secondary | ICD-10-CM | POA: Diagnosis not present

## 2018-07-03 DIAGNOSIS — R1310 Dysphagia, unspecified: Secondary | ICD-10-CM | POA: Diagnosis not present

## 2018-07-03 DIAGNOSIS — K222 Esophageal obstruction: Secondary | ICD-10-CM | POA: Diagnosis not present

## 2018-07-03 DIAGNOSIS — K641 Second degree hemorrhoids: Secondary | ICD-10-CM | POA: Diagnosis not present

## 2018-07-03 HISTORY — PX: COLONOSCOPY WITH PROPOFOL: SHX5780

## 2018-07-03 HISTORY — PX: ESOPHAGOGASTRODUODENOSCOPY (EGD) WITH PROPOFOL: SHX5813

## 2018-07-03 SURGERY — COLONOSCOPY WITH PROPOFOL
Anesthesia: General

## 2018-07-03 MED ORDER — LIDOCAINE HCL (CARDIAC) PF 100 MG/5ML IV SOSY
PREFILLED_SYRINGE | INTRAVENOUS | Status: DC | PRN
  Administered 2018-07-03: 60 mg via INTRATRACHEAL

## 2018-07-03 MED ORDER — PROPOFOL 10 MG/ML IV BOLUS
INTRAVENOUS | Status: DC | PRN
  Administered 2018-07-03: 40 mg via INTRAVENOUS
  Administered 2018-07-03 (×2): 30 mg via INTRAVENOUS
  Administered 2018-07-03: 20 mg via INTRAVENOUS
  Administered 2018-07-03: 90 mg via INTRAVENOUS
  Administered 2018-07-03: 50 mg via INTRAVENOUS
  Administered 2018-07-03: 40 mg via INTRAVENOUS
  Administered 2018-07-03 (×2): 30 mg via INTRAVENOUS
  Administered 2018-07-03: 40 mg via INTRAVENOUS

## 2018-07-03 MED ORDER — LIDOCAINE HCL (PF) 1 % IJ SOLN
2.0000 mL | Freq: Once | INTRAMUSCULAR | Status: AC
  Administered 2018-07-03: 0.3 mL via INTRADERMAL

## 2018-07-03 MED ORDER — SODIUM CHLORIDE 0.9 % IV SOLN
INTRAVENOUS | Status: DC
  Administered 2018-07-03: 1000 mL via INTRAVENOUS

## 2018-07-03 MED ORDER — LIDOCAINE HCL (PF) 1 % IJ SOLN
INTRAMUSCULAR | Status: AC
  Administered 2018-07-03: 0.3 mL via INTRADERMAL
  Filled 2018-07-03: qty 2

## 2018-07-03 MED ORDER — PROPOFOL 10 MG/ML IV BOLUS
INTRAVENOUS | Status: AC
  Filled 2018-07-03: qty 20

## 2018-07-03 NOTE — Anesthesia Post-op Follow-up Note (Signed)
Anesthesia QCDR form completed.        

## 2018-07-03 NOTE — H&P (Signed)
Lucilla Lame, MD [address] [place], [place] [address] Phone:[phone] Fax : [phone]  Primary Care Physician:  Paulene Floor Primary Gastroenterologist:  Dr. Allen Norris  Pre-Procedure History & Physical: HPI:  Jose Khan is a 58 y.o. male is here for an endoscopy and colonoscopy.   History reviewed. No pertinent past medical history.  Past Surgical History:  Procedure Laterality Date  . KNEE ARTHROSCOPY Right     Prior to Admission medications   Medication Sig Start Date End Date Taking? Authorizing Provider  rosuvastatin (CRESTOR) 20 MG tablet Take 1 tablet (20 mg total) by mouth daily. 06/08/18   Trinna Post, PA-C  typhoid (VIVOTIF) DR capsule Take 1 capsule by mouth every other day. 05/29/18   Trinna Post, PA-C    Allergies as of 06/01/2018  . (No Known Allergies)    Family History  Problem Relation Age of Onset  . Parkinson's disease Mother   . Macular degeneration Mother   . Breast cancer Mother   . Dementia Father   . CAD Father   . Dementia Maternal Grandmother     Social History   Socioeconomic History  . Marital status: Married    Spouse name: Not on file  . Number of children: Not on file  . Years of education: Not on file  . Highest education level: Not on file  Occupational History  . Not on file  Social Needs  . Financial resource strain: Not on file  . Food insecurity:    Worry: Not on file    Inability: Not on file  . Transportation needs:    Medical: Not on file    Non-medical: Not on file  Tobacco Use  . Smoking status: Never Smoker  . Smokeless tobacco: Never Used  Substance and Sexual Activity  . Alcohol use: Yes    Alcohol/week: 13.0 standard drinks    Types: 7 Glasses of wine, 1 Cans of beer, 5 Shots of liquor per week  . Drug use: No  . Sexual activity: Not on file  Lifestyle  . Physical activity:    Days per week: Not on file    Minutes per session: Not on file  . Stress: Not on  file  Relationships  . Social connections:    Talks on phone: Not on file    Gets together: Not on file    Attends religious service: Not on file    Active member of club or organization: Not on file    Attends meetings of clubs or organizations: Not on file    Relationship status: Not on file  . Intimate partner violence:    Fear of current or ex partner: Not on file    Emotionally abused: Not on file    Physically abused: Not on file    Forced sexual activity: Not on file  Other Topics Concern  . Not on file  Social History Narrative  . Not on file    Review of Systems: See HPI, otherwise negative ROS  Physical Exam: BP 136/80   Pulse 63   Temp (!) 97.5 F (36.4 C) (Tympanic)   Resp 16   Ht 6' (1.829 m)   Wt 103 kg   SpO2 98%   BMI 30.79 kg/m  General:   Alert,  pleasant and cooperative in NAD Head:  Normocephalic and atraumatic. Neck:  Supple; no masses or thyromegaly. Lungs:  Clear throughout to auscultation.    Heart:  Regular rate  and rhythm. Abdomen:  Soft, nontender and nondistended. Normal bowel sounds, without guarding, and without rebound.   Neurologic:  Alert and  oriented x4;  grossly normal neurologically.  Impression/Plan: Jose Khan is here for an endoscopy and colonoscopy to be performed for history of colon polyps 01/31/2014  Risks, benefits, limitations, and alternatives regarding  endoscopy and colonoscopy have been reviewed with the patient.  Questions have been answered.  All parties agreeable.   Lucilla Lame, MD  07/03/2018, 8:15 AM

## 2018-07-03 NOTE — Transfer of Care (Signed)
Immediate Anesthesia Transfer of Care Note  Patient: Jose Khan  Procedure(s) Performed: COLONOSCOPY WITH PROPOFOL (N/A ) ESOPHAGOGASTRODUODENOSCOPY (EGD) WITH PROPOFOL (N/A )  Patient Location: PACU and Endoscopy Unit  Anesthesia Type:MAC  Level of Consciousness: awake, alert  and oriented  Airway & Oxygen Therapy: Patient Spontanous Breathing  Post-op Assessment: Report given to RN and Post -op Vital signs reviewed and stable  Post vital signs: Reviewed and stable  Last Vitals:  Vitals Value Taken Time  BP    Temp    Pulse    Resp    SpO2      Last Pain:  Vitals:   07/03/18 0758  TempSrc: Tympanic  PainSc: 0-No pain         Complications: No apparent anesthesia complications

## 2018-07-03 NOTE — Op Note (Signed)
[facility] Gastroenterology Patient Name: Jose Khan Procedure Date: 07/03/2018 8:20 AM MRN: [id number] Account #: [id number] Date of Birth: 1960/12/11 Admit Type: Outpatient Age: 58 Room: [facility] ENDO ROOM 4 Gender: Male Note Status: Finalized Procedure:            Upper GI endoscopy Indications:          Dysphagia Providers:            Lucilla Lame MD, MD Referring MD:         Wendee Beavers. Terrilee Croak (Referring MD) Medicines:            Propofol per Anesthesia Complications:        No immediate complications. Procedure:            Pre-Anesthesia Assessment:                       - Prior to the procedure, a History and Physical was                        performed, and patient medications and allergies were                        reviewed. The patient's tolerance of previous                        anesthesia was also reviewed. The risks and benefits of                        the procedure and the sedation options and risks were                        discussed with the patient. All questions were                        answered, and informed consent was obtained. Prior                        Anticoagulants: The patient has taken no previous                        anticoagulant or antiplatelet agents. ASA Grade                        Assessment: II - A patient with mild systemic disease.                        After reviewing the risks and benefits, the patient was                        deemed in satisfactory condition to undergo the                        procedure.                       After obtaining informed consent, the endoscope was                        passed under direct vision. Throughout the procedure,  the patient's blood pressure, pulse, and oxygen                        saturations were monitored continuously. The Endoscope                        was introduced through the mouth, and advanced to the                        second  part of duodenum. The upper GI endoscopy was                        accomplished without difficulty. The patient tolerated                        the procedure well. Findings:      The Z-line was irregular and was found at the gastroesophageal junction.      One benign-appearing, intrinsic mild stenosis was found in the lower       third of the esophagus. The stenosis was traversed. A TTS dilator was       passed through the scope. Dilation with an 18-19-20 mm balloon dilator       was performed to 20 mm. The dilation site was examined following       endoscope reinsertion and showed complete resolution of luminal       narrowing.      The stomach was normal.      Localized moderate inflammation characterized by erythema was found in       the duodenal bulb.      Two biopsies were obtained with cold forceps for histology in the middle       third of the esophagus. Impression:           - Z-line irregular, at the gastroesophageal junction.                       - Benign-appearing esophageal stenosis. Dilated.                       - Normal stomach.                       - Duodenitis.                       - Biopsy performed in the middle third of the esophagus. Recommendation:       - Discharge patient to home.                       - Resume previous diet.                       - Continue present medications.                       - Await pathology results.                       - Perform a colonoscopy today. Procedure Code(s):    --- Professional ---                       [phone], Esophagogastroduodenoscopy, flexible, transoral;  with transendoscopic balloon dilation of esophagus                        (less than 30 mm diameter)                       43239, 59, Esophagogastroduodenoscopy, flexible,                        transoral; with biopsy, single or multiple Diagnosis Code(s):    --- Professional ---                       R13.10, Dysphagia, unspecified                        K29.80, Duodenitis without bleeding                       K22.2, Esophageal obstruction CPT copyright 2018 American Medical Association. All rights reserved. The codes documented in this report are preliminary and upon coder review may  be revised to meet current compliance requirements. Lucilla Lame MD, MD 07/03/2018 8:38:53 AM This report has been signed electronically. Number of Addenda: 0 Note Initiated On: 07/03/2018 8:20 AM      [facility]

## 2018-07-03 NOTE — Op Note (Signed)
[facility] Patient Name: Jose Khan Procedure Date: 07/03/2018 8:20 AM MRN: [id number] Account #: [id number] Date of Birth: 04-13-1961 Admit Type: Outpatient Age: 58 Room: [facility] ENDO ROOM 4 Gender: Male Note Status: Finalized Procedure:            Colonoscopy Indications:          High risk colon cancer surveillance: Personal history                        of colonic polyps Providers:            Lucilla Lame MD, MD Referring MD:         Wendee Beavers. Terrilee Croak (Referring MD) Medicines:            Propofol per Anesthesia Complications:        No immediate complications. Procedure:            Pre-Anesthesia Assessment:                       - Prior to the procedure, a History and Physical was                        performed, and patient medications and allergies were                        reviewed. The patient's tolerance of previous                        anesthesia was also reviewed. The risks and benefits of                        the procedure and the sedation options and risks were                        discussed with the patient. All questions were                        answered, and informed consent was obtained. Prior                        Anticoagulants: The patient has taken no previous                        anticoagulant or antiplatelet agents. ASA Grade                        Assessment: II - A patient with mild systemic disease.                        After reviewing the risks and benefits, the patient was                        deemed in satisfactory condition to undergo the                        procedure.                       After obtaining informed consent, the colonoscope was  passed under direct vision. Throughout the procedure,                        the patient's blood pressure, pulse, and oxygen                        saturations were monitored continuously. The                        Colonoscope was  introduced through the anus and                        advanced to the the cecum, identified by appendiceal                        orifice and ileocecal valve. The colonoscopy was                        performed without difficulty. The patient tolerated the                        procedure well. The quality of the bowel preparation                        was excellent. Findings:      The perianal and digital rectal examinations were normal.      Two sessile polyps were found in the sigmoid colon. The polyps were 2 to       3 mm in size. These polyps were removed with a cold biopsy forceps.       Resection and retrieval were complete.      Non-bleeding internal hemorrhoids were found during retroflexion. The       hemorrhoids were Grade II (internal hemorrhoids that prolapse but reduce       spontaneously). Impression:           - Two 2 to 3 mm polyps in the sigmoid colon, removed                        with a cold biopsy forceps. Resected and retrieved.                       - Non-bleeding internal hemorrhoids. Recommendation:       - Discharge patient to home.                       - Resume previous diet.                       - Continue present medications.                       - Await pathology results.                       - Repeat colonoscopy in 5 years for surveillance. Procedure Code(s):    --- Professional ---                       [id number], Colonoscopy, flexible; with biopsy, single or  multiple Diagnosis Code(s):    --- Professional ---                       Z86.010, Personal history of colonic polyps                       D12.5, Benign neoplasm of sigmoid colon CPT copyright 2018 American Medical Association. All rights reserved. The codes documented in this report are preliminary and upon coder review may  be revised to meet current compliance requirements. Lucilla Lame MD, MD 07/03/2018 8:56:25 AM This report has been signed electronically. Number of  Addenda: 0 Note Initiated On: 07/03/2018 8:20 AM Scope Withdrawal Time: 0 hours 8 minutes 36 seconds  Total Procedure Duration: 0 hours 14 minutes 1 second       [facility]

## 2018-07-03 NOTE — Anesthesia Preprocedure Evaluation (Addendum)
Anesthesia Evaluation  Patient identified by MRN, date of birth, ID band Patient awake    Reviewed: Allergy & Precautions, H&P , NPO status , Patient's Chart, lab work & pertinent test results  Airway Mallampati: III       Dental  (+) Teeth Intact   Pulmonary neg pulmonary ROS,           Cardiovascular negative cardio ROS       Neuro/Psych negative neurological ROS  negative psych ROS   GI/Hepatic negative GI ROS, Neg liver ROS,   Endo/Other  negative endocrine ROS  Renal/GU negative Renal ROS  negative genitourinary   Musculoskeletal   Abdominal   Peds  Hematology negative hematology ROS (+)   Anesthesia Other Findings History reviewed. No pertinent past medical history.  Past Surgical History: No date: KNEE ARTHROSCOPY; Right  BMI    Body Mass Index:  30.79 kg/m      Reproductive/Obstetrics negative OB ROS                            Anesthesia Physical Anesthesia Plan  ASA: I  Anesthesia Plan: General   Post-op Pain Management:    Induction:   PONV Risk Score and Plan: Propofol infusion and TIVA  Airway Management Planned: Natural Airway and Nasal Cannula  Additional Equipment:   Intra-op Plan:   Post-operative Plan:   Informed Consent: I have reviewed the patients History and Physical, chart, labs and discussed the procedure including the risks, benefits and alternatives for the proposed anesthesia with the patient or authorized representative who has indicated his/her understanding and acceptance.   Dental Advisory Given  Plan Discussed with: Anesthesiologist, CRNA and Surgeon  Anesthesia Plan Comments:         Anesthesia Quick Evaluation

## 2018-07-04 ENCOUNTER — Encounter: Payer: Self-pay | Admitting: Gastroenterology

## 2018-07-04 LAB — SURGICAL PATHOLOGY

## 2018-07-04 NOTE — Anesthesia Postprocedure Evaluation (Signed)
Anesthesia Post Note  Patient: Jose Khan  Procedure(s) Performed: COLONOSCOPY WITH PROPOFOL (N/A ) ESOPHAGOGASTRODUODENOSCOPY (EGD) WITH PROPOFOL (N/A )  Patient location during evaluation: PACU Anesthesia Type: General Level of consciousness: awake and alert Pain management: pain level controlled Vital Signs Assessment: post-procedure vital signs reviewed and stable Respiratory status: spontaneous breathing, nonlabored ventilation and respiratory function stable Cardiovascular status: blood pressure returned to baseline and stable Postop Assessment: no apparent nausea or vomiting Anesthetic complications: no     Last Vitals:  Vitals:   07/03/18 0758 07/03/18 0903  BP: 136/80   Pulse: 63   Resp: 16   Temp: (!) 36.4 C (!) 36.2 C  SpO2: 98%     Last Pain:  Vitals:   07/04/18 0743  TempSrc:   PainSc: 0-No pain                 Durenda Hurt

## 2018-07-10 ENCOUNTER — Ambulatory Visit: Payer: Self-pay

## 2018-07-10 ENCOUNTER — Ambulatory Visit: Payer: BLUE CROSS/BLUE SHIELD | Admitting: Physician Assistant

## 2018-07-10 ENCOUNTER — Encounter: Payer: Self-pay | Admitting: Physician Assistant

## 2018-07-10 VITALS — BP 116/79 | HR 56 | Temp 97.9°F | Resp 16 | Wt 229.0 lb

## 2018-07-10 DIAGNOSIS — E7801 Familial hypercholesterolemia: Secondary | ICD-10-CM

## 2018-07-10 DIAGNOSIS — Z23 Encounter for immunization: Secondary | ICD-10-CM

## 2018-07-10 NOTE — Progress Notes (Signed)
Patient: Jose Khan Male    DOB: 1961-02-01   58 y.o.   MRN: [id number] Visit Date: 07/10/2018  Today's Provider: Trinna Post, PA-C   Chief Complaint  Patient presents with  . Follow-up   Subjective:     HPI  Follow up for hyperlipidemia  The patient was last seen for this 1 months ago. Changes made at last visit include increase Crestor from 10 mg to 20 mg daily. He is doing well with this, denies side effects including muscle cramps.   He reports excellent compliance with treatment. He feels that condition is Unchanged. He is not having side effects.    He is also due for 2/3 hepatitis B vaccination. He is about to leave for international trip this weekend.   In the interim, he has had colonoscopy and upper endoscopy  ------------------------------------------------------------------------------------    No Known Allergies   Current Outpatient Medications:  .  rosuvastatin (CRESTOR) 20 MG tablet, Take 1 tablet (20 mg total) by mouth daily., Disp: 90 tablet, Rfl: 0 .  typhoid (VIVOTIF) DR capsule, Take 1 capsule by mouth every other day., Disp: 4 capsule, Rfl: 0  Review of Systems  Constitutional: Negative.   Cardiovascular: Negative.   Musculoskeletal: Negative.     Social History   Tobacco Use  . Smoking status: Never Smoker  . Smokeless tobacco: Never Used  Substance Use Topics  . Alcohol use: Yes    Alcohol/week: 13.0 standard drinks    Types: 7 Glasses of wine, 1 Cans of beer, 5 Shots of liquor per week      Objective:   BP 116/79 (BP Location: Left Arm, Patient Position: Sitting, Cuff Size: Normal)   Pulse (!) 56   Temp 97.9 F (36.6 C) (Oral)   Resp 16   Wt 229 lb (103.9 kg)   BMI 31.06 kg/m  Vitals:   07/10/18 0822  BP: 116/79  Pulse: (!) 56  Resp: 16  Temp: 97.9 F (36.6 C)  TempSrc: Oral  Weight: 229 lb (103.9 kg)     Physical Exam Constitutional:      Appearance: Normal appearance.  Cardiovascular:   Rate and Rhythm: Normal rate and regular rhythm.     Heart sounds: Normal heart sounds.  Pulmonary:     Effort: Pulmonary effort is normal.     Breath sounds: Normal breath sounds.  Skin:    General: Skin is warm and dry.  Neurological:     General: No focal deficit present.     Mental Status: He is alert and oriented to person, place, and time.  Psychiatric:        Mood and Affect: Mood normal.        Behavior: Behavior normal.         Assessment & Plan    1. Familial hypercholesterolemia  Cholesterol much improved on labwork but not quite at goal. Increase crestor 20 mg to crestor 40 mg daily. Follow up 4 mo for 3/3 hepatitis B vaccine and cholesterol labs.  - Lipid Profile  2. Need for hepatitis B vaccination  - Hepatitis B vaccine adult IM  The entirety of the information documented in the History of Present Illness, Review of Systems and Physical Exam were personally obtained by me. Portions of this information were initially documented by Lynford Humphrey, CMA and reviewed by me for thoroughness and accuracy.       Trinna Post, PA-C  [facility]

## 2018-07-10 NOTE — Patient Instructions (Signed)
Lipid Profile Test  Why am I having this test?  The lipid profile test can be used to help evaluate your risk for developing heart disease. The test is also used to monitor your levels during treatment for high cholesterol to see if you are reaching your goals.  What is being tested?  A lipid profile measures the following:   Total cholesterol. Cholesterol is a waxy, fat-like substance in your blood. If your total cholesterol level is high, this can increase your risk for heart disease.   High-density lipoprotein (HDL). This is known as the good cholesterol. Having high levels of HDL decreases your risk for heart disease. Your HDL level may be low if you smoke or do not get enough exercise.   Low-density lipoprotein (LDL). This is known as the bad cholesterol. This type causes plaque to build up in your arteries. Having a low level of LDL is best. Having high levels of LDL increases your risk for heart disease.   Cholesterol to HDL ratio. This is calculated by dividing your total cholesterol by your HDL cholesterol. The ratio is used by health care providers to determine your risk for heart disease. A low ratio is best.   Triglycerides. These are fats that your body can store or burn for energy. Low levels are best. Having high levels of triglycerides increases your risk for heart disease.  What kind of sample is taken?    A blood sample is required for this test. It is usually collected by inserting a needle into a blood vessel.  How do I prepare for this test?  Do not drink alcohol starting at least 24 hours before your test.  Follow any instructions from your health care provider about dietary restrictions before your test.  Do not eat or drink anything other than water after midnight on the night before the test, or as told by your health care provider.  Tell a health care provider about:   All medicines you are taking, including vitamins, herbs, eye drops, creams, and over-the-counter medicines.   Any  medical conditions you have.   Whether you are pregnant or may be pregnant.  How are the results reported?  Your test results will be reported as values that indicate your cholesterol and triglyceride levels. Your health care provider will compare your results to normal ranges that were established after testing a large group of people (reference ranges). Reference ranges may vary among labs and hospitals. For this test, common reference ranges are:  Total cholesterol   Adult or elderly: less than 200 mg/dL.   Child: 120-200 mg/dL.   Infant: 70-175 mg/dL.   Newborn: 53-135 mg/dL.  HDL   Male: greater than 45 mg/dL.   Male: greater than 55 mg/dL.  HDL reference values based on your risk for heart disease:   Low risk for heart disease:  ? Male: 60 mg/dL.  ? Male: 70 mg/dL.   Moderate risk for heart disease:  ? Male: 45 mg/dL.  ? Male: 55 mg/dL.   High risk for heart disease:  ? Male: 25 mg/dL.  ? Male: 35 mg/dL.  LDL   Adults: Your health care provider will determine a target level for LDL based on your risk for heart disease.  ? If you are at low risk, your LDL should be 130 mg/dL or less.  ? If you are at moderate risk, your LDL should be 100 mg/dL or less.  ? If you are at high risk, your LDL should   be 70 mg/dL or less.   Children: less than 110 mg/dL.  Cholesterol to HDL ratio  Reference values based on your risk for heart disease:   Risk that is half the average risk:  ? Male: 3.4.  ? Male: 3.3.   Average risk:  ? Male: 5.0.  ? Male: 4.4.   Risk that is two times average (moderate risk):  ? Male: 10.0.  ? Male: 7.0.   Risk that is three times average (high risk):  ? Male: 24.0.  ? Male: 11.0.  Triglycerides   Adult or elderly:  ? Male: 40-160 mg/dL.  ? Male: 35-135 mg/dL.   Children 16-19 years old:  ? Male: 40-163 mg/dL.  ? Male: 40-128 mg/dL.   Children 12-15 years old:  ? Male: 36-138 mg/dL.  ? Male: 41-138 mg/dL.   Children 6-11 years old:  ? Male: 31-108  mg/dL.  ? Male: 35-114 mg/dL.   Children 0-5 years old:  ? Male: 30-86 mg/dL.  ? Male: 32-99 mg/dL.  Triglycerides should be less than 400 mg/dL even when you are not fasting.  What do the results mean?  Results that are within the reference ranges are considered normal. Total cholesterol, LDL, and triglyceride levels that are higher than the reference ranges can mean that you have an increased risk for heart disease. An HDL level that is lower than the reference range can also indicate an increased risk.  Talk with your health care provider about what your results mean.  Questions to ask your health care provider  Ask your health care provider, or the department that is doing the test:   When will my results be ready?   How will I get my results?   What are my treatment options?   What other tests do I need?   What are my next steps?  Summary   The lipid profile test can be used to help predict the likelihood that you will develop heart disease. It can also help monitor your cholesterol levels during treatment.   A lipid profile measures your total cholesterol, high-density lipoprotein (HDL), low-density lipoprotein (LDL), cholesterol to HDL ratio, and triglycerides.   Total cholesterol, LDL, and triglyceride levels that are higher than the reference ranges can indicate an increased risk for heart disease.   An HDL level that is lower than the reference range can indicate an increased risk for heart disease.   Talk with your health care provider about what your results mean.  This information is not intended to replace advice given to you by your health care provider. Make sure you discuss any questions you have with your health care provider.  Document Released: 07/07/2004 Document Revised: 03/20/2017 Document Reviewed: 03/20/2017  [employer]vier Interactive Patient Education  2019 [employer]

## 2018-07-11 ENCOUNTER — Telehealth: Payer: Self-pay

## 2018-07-11 LAB — LIPID PANEL
Chol/HDL Ratio: 3.6 ratio (ref 0.0–5.0)
Cholesterol, Total: 178 mg/dL (ref 100–199)
HDL: 49 mg/dL (ref >39)
LDL Calculated: 112 mg/dL — ABNORMAL HIGH (ref 0–99)
Triglycerides: 83 mg/dL (ref 0–149)
VLDL Cholesterol Cal: 17 mg/dL (ref 5–40)

## 2018-07-11 NOTE — Telephone Encounter (Signed)
-----   Message from Trinna Post, [place] sent at 07/11/2018  9:26 AM EST ----- Cholesterol significantly improved on crestor, total decreased from 293 to 178 and LDL decreased from 215 to 112. Still slightly above goal and would like to increase crestor to 40 mg if he is OK with it. He can continue 20 mg and start 40 mg after his trip if he is concerned about any potential side effects and we will see him at his scheduled follow up to repeat labs.

## 2018-07-11 NOTE — Telephone Encounter (Signed)
[id number] 

## 2018-07-11 NOTE — Telephone Encounter (Signed)
Pt called back.  Please call pt.  Thanks, [employer]

## 2018-07-11 NOTE — Telephone Encounter (Signed)
Patient advised as below. Patient verbalizes understanding and is in agreement with treatment plan.  

## 2018-08-04 ENCOUNTER — Other Ambulatory Visit: Payer: Self-pay | Admitting: Physician Assistant

## 2018-08-04 DIAGNOSIS — E785 Hyperlipidemia, unspecified: Secondary | ICD-10-CM

## 2018-09-07 ENCOUNTER — Other Ambulatory Visit: Payer: Self-pay | Admitting: Physician Assistant

## 2018-09-07 DIAGNOSIS — E785 Hyperlipidemia, unspecified: Secondary | ICD-10-CM

## 2018-09-07 MED ORDER — ROSUVASTATIN CALCIUM 40 MG PO TABS: 40.0000 mg | ORAL_TABLET | Freq: Every day | ORAL | 1 refills | Status: DC

## 2018-11-05 ENCOUNTER — Encounter: Payer: Self-pay | Admitting: Physician Assistant

## 2018-11-06 ENCOUNTER — Encounter: Payer: Self-pay | Admitting: Physician Assistant

## 2018-11-06 ENCOUNTER — Other Ambulatory Visit: Payer: Self-pay

## 2018-11-06 ENCOUNTER — Ambulatory Visit: Payer: BLUE CROSS/BLUE SHIELD | Admitting: Physician Assistant

## 2018-11-06 VITALS — BP 132/91 | HR 54 | Temp 98.2°F | Resp 16 | Wt 232.0 lb

## 2018-11-06 DIAGNOSIS — K760 Fatty (change of) liver, not elsewhere classified: Secondary | ICD-10-CM

## 2018-11-06 DIAGNOSIS — E785 Hyperlipidemia, unspecified: Secondary | ICD-10-CM | POA: Diagnosis not present

## 2018-11-06 DIAGNOSIS — M79645 Pain in left finger(s): Secondary | ICD-10-CM

## 2018-11-06 DIAGNOSIS — Z23 Encounter for immunization: Secondary | ICD-10-CM | POA: Diagnosis not present

## 2018-11-06 NOTE — Patient Instructions (Signed)
Lipid Profile Test  Why am I having this test?  The lipid profile test can be used to help evaluate your risk for developing heart disease. The test is also used to monitor your levels during treatment for high cholesterol to see if you are reaching your goals.  What is being tested?  A lipid profile measures the following:   Total cholesterol. Cholesterol is a waxy, fat-like substance in your blood. If your total cholesterol level is high, this can increase your risk for heart disease.   High-density lipoprotein (HDL). This is known as the good cholesterol. Having high levels of HDL decreases your risk for heart disease. Your HDL level may be low if you smoke or do not get enough exercise.   Low-density lipoprotein (LDL). This is known as the bad cholesterol. This type causes plaque to build up in your arteries. Having a low level of LDL is best. Having high levels of LDL increases your risk for heart disease.   Cholesterol to HDL ratio. This is calculated by dividing your total cholesterol by your HDL cholesterol. The ratio is used by health care providers to determine your risk for heart disease. A low ratio is best.   Triglycerides. These are fats that your body can store or burn for energy. Low levels are best. Having high levels of triglycerides increases your risk for heart disease.  What kind of sample is taken?    A blood sample is required for this test. It is usually collected by inserting a needle into a blood vessel.  How do I prepare for this test?  Do not drink alcohol starting at least 24 hours before your test.  Follow any instructions from your health care provider about dietary restrictions before your test.  Do not eat or drink anything other than water after midnight on the night before the test, or as told by your health care provider.  Tell a health care provider about:   All medicines you are taking, including vitamins, herbs, eye drops, creams, and over-the-counter medicines.   Any  medical conditions you have.   Whether you are pregnant or may be pregnant.  How are the results reported?  Your test results will be reported as values that indicate your cholesterol and triglyceride levels. Your health care provider will compare your results to normal ranges that were established after testing a large group of people (reference ranges). Reference ranges may vary among labs and hospitals. For this test, common reference ranges are:  Total cholesterol   Adult or elderly: less than 200 mg/dL.   Child: 120-200 mg/dL.   Infant: 70-175 mg/dL.   Newborn: 53-135 mg/dL.  HDL   Male: greater than 45 mg/dL.   Male: greater than 55 mg/dL.  HDL reference values based on your risk for heart disease:   Low risk for heart disease:  ? Male: 60 mg/dL.  ? Male: 70 mg/dL.   Moderate risk for heart disease:  ? Male: 45 mg/dL.  ? Male: 55 mg/dL.   High risk for heart disease:  ? Male: 25 mg/dL.  ? Male: 35 mg/dL.  LDL   Adults: Your health care provider will determine a target level for LDL based on your risk for heart disease.  ? If you are at low risk, your LDL should be 130 mg/dL or less.  ? If you are at moderate risk, your LDL should be 100 mg/dL or less.  ? If you are at high risk, your LDL should   be 70 mg/dL or less.   Children: less than 110 mg/dL.  Cholesterol to HDL ratio  Reference values based on your risk for heart disease:   Risk that is half the average risk:  ? Male: 3.4.  ? Male: 3.3.   Average risk:  ? Male: 5.0.  ? Male: 4.4.   Risk that is two times average (moderate risk):  ? Male: 10.0.  ? Male: 7.0.   Risk that is three times average (high risk):  ? Male: 24.0.  ? Male: 11.0.  Triglycerides   Adult or elderly:  ? Male: 40-160 mg/dL.  ? Male: 35-135 mg/dL.   Children 16-19 years old:  ? Male: 40-163 mg/dL.  ? Male: 40-128 mg/dL.   Children 12-15 years old:  ? Male: 36-138 mg/dL.  ? Male: 41-138 mg/dL.   Children 6-11 years old:  ? Male: 31-108  mg/dL.  ? Male: 35-114 mg/dL.   Children 0-5 years old:  ? Male: 30-86 mg/dL.  ? Male: 32-99 mg/dL.  Triglycerides should be less than 400 mg/dL even when you are not fasting.  What do the results mean?  Results that are within the reference ranges are considered normal. Total cholesterol, LDL, and triglyceride levels that are higher than the reference ranges can mean that you have an increased risk for heart disease. An HDL level that is lower than the reference range can also indicate an increased risk.  Talk with your health care provider about what your results mean.  Questions to ask your health care provider  Ask your health care provider, or the department that is doing the test:   When will my results be ready?   How will I get my results?   What are my treatment options?   What other tests do I need?   What are my next steps?  Summary   The lipid profile test can be used to help predict the likelihood that you will develop heart disease. It can also help monitor your cholesterol levels during treatment.   A lipid profile measures your total cholesterol, high-density lipoprotein (HDL), low-density lipoprotein (LDL), cholesterol to HDL ratio, and triglycerides.   Total cholesterol, LDL, and triglyceride levels that are higher than the reference ranges can indicate an increased risk for heart disease.   An HDL level that is lower than the reference range can indicate an increased risk for heart disease.   Talk with your health care provider about what your results mean.  This information is not intended to replace advice given to you by your health care provider. Make sure you discuss any questions you have with your health care provider.  Document Released: 07/07/2004 Document Revised: 03/20/2017 Document Reviewed: 03/20/2017  [employer]vier Interactive Patient Education  2019 [employer]

## 2018-11-06 NOTE — Progress Notes (Signed)
Patient: Jose Khan Male    DOB: 1960-07-02   58 y.o.   MRN: [id number] Visit Date: 11/06/2018  Today's Provider: Trinna Post, PA-C   Chief Complaint  Patient presents with  . 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 He has been golfing more frequently.    Lipid/Cholesterol, Follow-up:   Last seen for this4 months ago.  Management changes since that visit include increasing Crestor from 20 mg to 40mg . Doing well on this, denies myalgias.  . Last Lipid Panel:    Component Value Date/Time   CHOL 178 07/10/2018 0857   TRIG 83 07/10/2018 0857   HDL 49 07/10/2018 0857   CHOLHDL 3.6 07/10/2018 0857   CHOLHDL 6.5 (H) 04/28/2017 0808   LDLCALC 112 (H) 07/10/2018 0857   LDLCALC 223 (H) 04/28/2017 4098     He reports excellent compliance with treatment. He is not having side effects.  Current symptoms include none and have been stable. Weight trend: stable Prior visit with dietician: no Current diet: in general, a "healthy" diet   Current exercise: cardiovascular workout on exercise equipment and walking  Wt Readings from Last 3 Encounters:  11/06/18 232 lb (105.2 kg)  07/10/18 229 lb (103.9 kg)  07/03/18 227 lb (103 kg)   BP Readings from Last 3 Encounters:  11/06/18 (!) 132/91  07/10/18 116/79  07/03/18 136/80   Fatty Liver: He is due for third of three hepatitis vaccines today.   Left Finger Pain: Reports some pain and swelling in the middle joint of his left index finger. He is right hand dominant. He has playing more golf recently. Can still move his finger and grab objects without issue.  -------------------------------------------------------------------   No Known Allergies   Current Outpatient Medications:  .  rosuvastatin (CRESTOR) 40 MG tablet, Take 1 tablet (40 mg total) by mouth daily., Disp: 90 tablet, Rfl: 1  Review of Systems  Cardiovascular: Negative for chest pain, palpitations and  leg swelling.    Social History   Tobacco Use  . Smoking status: Never Smoker  . Smokeless tobacco: Never Used  Substance Use Topics  . Alcohol use: Yes    Alcohol/week: 13.0 standard drinks    Types: 7 Glasses of wine, 1 Cans of beer, 5 Shots of liquor per week      Objective:   BP (!) 132/91 (BP Location: Left Arm, Patient Position: Sitting, Cuff Size: Large)   Pulse (!) 54   Temp 98.2 F (36.8 C) (Oral)   Resp 16   Wt 232 lb (105.2 kg)   BMI 31.46 kg/m  Vitals:   11/06/18 0809  BP: (!) 132/91  Pulse: (!) 54  Resp: 16  Temp: 98.2 F (36.8 C)  TempSrc: Oral  Weight: 232 lb (105.2 kg)     Physical Exam Constitutional:      Appearance: Normal appearance.  Cardiovascular:     Rate and Rhythm: Normal rate and regular rhythm.     Heart sounds: Normal heart sounds.  Pulmonary:     Effort: Pulmonary effort is normal.     Breath sounds: Normal breath sounds.  Musculoskeletal:     Left hand: He exhibits normal range of motion, no tenderness and no bony tenderness.       Hands:  Neurological:     Mental Status: He is alert.  Psychiatric:        Mood and Affect:  Mood normal.        Behavior: Behavior normal.         Assessment & Plan    1. Hyperlipidemia, unspecified hyperlipidemia type  Crestor had been increased to 40 mg daily last visit. His cholesterol has been decreased by half. Will check again today. If controlled, will have him come back for CPE and f/u in 6 months.  - Lipid Profile  2. Need for hepatitis B vaccination  Vaccine series completed today.   - Hepatitis B vaccine adult IM  3. Fatty liver   4. Pain of finger of left hand  Possibly some arthritis, possibly pain from the way he holds his golf club. Does not want xray right now, may consider in the future. Can do ice pack and NSAIDs PRN.   The entirety of the information documented in the History of Present Illness, Review of Systems and Physical Exam were personally obtained by me.  Portions of this information were initially documented by Lyndel Pleasure, CMA and reviewed by me for thoroughness and accuracy.    Return in about 6 months (around 05/09/2019) for cpe and follow up .     Trinna Post, PA-C  [facility]

## 2018-11-07 LAB — LIPID PANEL
Chol/HDL Ratio: 3.5 ratio (ref 0.0–5.0)
Cholesterol, Total: 186 mg/dL (ref 100–199)
HDL: 53 mg/dL (ref >39)
LDL Calculated: 115 mg/dL — ABNORMAL HIGH (ref 0–99)
Triglycerides: 89 mg/dL (ref 0–149)
VLDL Cholesterol Cal: 18 mg/dL (ref 5–40)

## 2018-12-10 ENCOUNTER — Other Ambulatory Visit: Payer: Self-pay | Admitting: Physician Assistant

## 2018-12-10 DIAGNOSIS — E785 Hyperlipidemia, unspecified: Secondary | ICD-10-CM

## 2018-12-10 NOTE — Telephone Encounter (Signed)
LOV 11/06/2018

## 2018-12-18 ENCOUNTER — Ambulatory Visit: Payer: Self-pay

## 2018-12-18 DIAGNOSIS — L821 Other seborrheic keratosis: Secondary | ICD-10-CM | POA: Diagnosis not present

## 2019-02-19 DIAGNOSIS — L309 Dermatitis, unspecified: Secondary | ICD-10-CM | POA: Diagnosis not present

## 2019-03-06 NOTE — Progress Notes (Signed)
Patient: Jose Khan Male    DOB: August 03, 1960   58 y.o.   MRN: [id number] Visit Date: 03/07/2019  Today's Provider: Trinna Post, PA-C   Chief Complaint  Patient presents with  . Arm Pain    Started a few weeks ago   Subjective:     Arm Pain  The incident occurred more than 1 week ago. There was no injury mechanism. The pain is present in the upper left arm. The pain does not radiate. Pertinent negatives include no chest pain, muscle weakness, numbness or tingling. The symptoms are aggravated by movement. He has tried NSAIDs for the symptoms. The treatment provided mild relief.   Left upper arm and shoulder pain x several weeks, started rather suddenly. Previous traumatic injury of his right arm, which is his dominant hand. He has pain with back pocket motion and putting on backpacks. He does weight lift and he does golf. He denies loss of grip strength, numbness, tingling. He took one advil without relief.   Wt Readings from Last 3 Encounters:  03/07/19 222 lb (100.7 kg)  11/06/18 232 lb (105.2 kg)  07/10/18 229 lb (103.9 kg)     No Known Allergies   Current Outpatient Medications:  .  rosuvastatin (CRESTOR) 40 MG tablet, TAKE 1 TABLET BY MOUTH DAILY, Disp: 90 tablet, Rfl: 1  Review of Systems  Constitutional: Negative.  Negative for appetite change, chills and fever.  Respiratory: Negative.  Negative for chest tightness, shortness of breath and wheezing.   Cardiovascular: Negative for chest pain and palpitations.  Gastrointestinal: Negative.  Negative for abdominal pain, nausea and vomiting.  Musculoskeletal: Positive for arthralgias. Negative for back pain, gait problem, joint swelling, myalgias, neck pain and neck stiffness.  Neurological: Negative for dizziness, tingling, light-headedness, numbness and headaches.    Social History   Tobacco Use  . Smoking status: Never Smoker  . Smokeless tobacco: Never Used  Substance Use Topics  . Alcohol use: Yes     Alcohol/week: 13.0 standard drinks    Types: 7 Glasses of wine, 1 Cans of beer, 5 Shots of liquor per week      Objective:   BP 123/86 (BP Location: Right Arm, Patient Position: Sitting, Cuff Size: Large)   Pulse 62   Temp (!) 96.9 F (36.1 C) (Temporal)   Wt 222 lb (100.7 kg)   BMI 30.11 kg/m  Vitals:   03/07/19 0808  BP: 123/86  Pulse: 62  Temp: (!) 96.9 F (36.1 C)  TempSrc: Temporal  Weight: 222 lb (100.7 kg)  Body mass index is 30.11 kg/m.   Physical Exam Constitutional:      Appearance: Normal appearance.  Cardiovascular:     Rate and Rhythm: Normal rate and regular rhythm.     Heart sounds: Normal heart sounds.  Pulmonary:     Effort: Pulmonary effort is normal.     Breath sounds: Normal breath sounds.  Musculoskeletal:     Right shoulder: Normal.     Left shoulder: He exhibits pain. He exhibits normal range of motion, no tenderness, no swelling, no crepitus, no deformity and normal strength.     Comments: Strength 5/5 bilateral upper extremities. Belly press and lift off test positive on left shoulder.   Skin:    General: Skin is warm and dry.  Neurological:     Mental Status: He is alert and oriented to person, place, and time. Mental status is at baseline.  Psychiatric:  Mood and Affect: Mood normal.        Behavior: Behavior normal.      No results found for any visits on 03/07/19.     Assessment & Plan    1. Impingement syndrome of left shoulder  Would recommend resting from weight lifting for a week or so. Can ice for 10 minutes three times daily. Advil one pill twice daily. Might consider xray, steroid shot, physical therapy and/or ortho referral if not improving.   2. Need for influenza vaccination  - Flu Vaccine QUAD 6+ mos PF IM (Fluarix Quad PF)  The entirety of the information documented in the History of Present Illness, Review of Systems and Physical Exam were personally obtained by me. Portions of this information were  initially documented by Ashley Royalty, CMA and reviewed by me for thoroughness and accuracy.   F/u PRN    Trinna Post, PA-C  [facility]

## 2019-03-07 ENCOUNTER — Encounter: Payer: Self-pay | Admitting: Physician Assistant

## 2019-03-07 ENCOUNTER — Ambulatory Visit: Payer: BLUE CROSS/BLUE SHIELD | Admitting: Physician Assistant

## 2019-03-07 ENCOUNTER — Other Ambulatory Visit: Payer: Self-pay

## 2019-03-07 VITALS — BP 123/86 | HR 62 | Temp 96.9°F | Wt 222.0 lb

## 2019-03-07 DIAGNOSIS — Z23 Encounter for immunization: Secondary | ICD-10-CM | POA: Diagnosis not present

## 2019-03-07 DIAGNOSIS — M7542 Impingement syndrome of left shoulder: Secondary | ICD-10-CM | POA: Diagnosis not present

## 2019-03-07 NOTE — Patient Instructions (Addendum)
Advil one pill twice daily for two weeks and ICE three times daily.   Shoulder Impingement Syndrome  Shoulder impingement syndrome is a condition that causes pain when connective tissues (tendons) surrounding the shoulder joint become pinched. These tendons are part of the group of muscles and tissues that help to stabilize the shoulder (rotator cuff). Beneath the rotator cuff is a fluid-filled sac (bursa) that allows the muscles and tendons to glide smoothly. The bursa may become swollen or irritated (bursitis). Bursitis, swelling in the rotator cuff tendons, or both conditions can decrease how much space is under a bone in the shoulder joint (acromion), resulting in impingement. What are the causes? Shoulder impingement syndrome may be caused by bursitis or swelling of the rotator cuff tendons, which may result from:  Repetitive overhead arm movements.  Falling onto the shoulder.  Weakness in the shoulder muscles. What increases the risk? You may be more likely to develop this condition if you:  Play sports that involve throwing, such as baseball.  Participate in sports such as tennis, volleyball, and swimming.  Work as a Curator, Games developer, or Architect. Some people are also more likely to develop impingement syndrome because of the shape of their acromion bone. What are the signs or symptoms? The main symptom of this condition is pain on the front or side of the shoulder. The pain may:  Get worse when lifting or raising the arm.  Get worse at night.  Wake you up from sleeping.  Feel sharp when the shoulder is moved and then fade to an ache. Other symptoms may include:  Tenderness.  Stiffness.  Inability to raise the arm above shoulder level or behind the body.  Weakness. How is this diagnosed? This condition may be diagnosed based on:  Your symptoms and medical history.  A physical exam.  Imaging tests, such as: ? X-rays. ? MRI. ? Ultrasound. How is this  treated? This condition may be treated by:  Resting your shoulder and avoiding all activities that cause pain or put stress on the shoulder.  Icing your shoulder.  NSAIDs to help reduce pain and swelling.  One or more injections of medicines to numb the area and reduce inflammation.  Physical therapy.  Surgery. This may be needed if nonsurgical treatments have not helped. Surgery may involve repairing the rotator cuff, reshaping the acromion, or removing the bursa. Follow these instructions at home: Managing pain, stiffness, and swelling   If directed, put ice on the injured area. ? Put ice in a plastic bag. ? Place a towel between your skin and the bag. ? Leave the ice on for 20 minutes, 2-3 times a day. Activity  Rest and return to your normal activities as told by your health care provider. Ask your health care provider what activities are safe for you.  Do exercises as told by your health care provider. General instructions  Do not use any products that contain nicotine or tobacco, such as cigarettes, e-cigarettes, and chewing tobacco. These can delay healing. If you need help quitting, ask your health care provider.  Ask your health care provider when it is safe for you to drive.  Take over-the-counter and prescription medicines only as told by your health care provider.  Keep all follow-up visits as told by your health care provider. This is important. How is this prevented?  Give your body time to rest between periods of activity.  Be safe and responsible while being active. This will help you avoid falls.  Maintain physical fitness, including strength and flexibility. Contact a health care provider if:  Your symptoms have not improved after 1-2 months of treatment and rest.  You cannot lift your arm away from your body. Summary  Shoulder impingement syndrome is a condition that causes pain when connective tissues (tendons) surrounding the shoulder joint  become pinched.  The main symptom of this condition is pain on the front or side of the shoulder.  This condition is usually treated with rest, ice, and pain medicines as needed. This information is not intended to replace advice given to you by your health care provider. Make sure you discuss any questions you have with your health care provider. Document Released: 06/13/2005 Document Revised: 10/05/2018 Document Reviewed: 12/06/2017 Elsevier Patient Education  2020 [employer].

## 2019-03-17 ENCOUNTER — Encounter: Payer: Self-pay | Admitting: Physician Assistant

## 2019-03-18 ENCOUNTER — Encounter: Payer: Self-pay | Admitting: Physician Assistant

## 2019-03-18 DIAGNOSIS — M7541 Impingement syndrome of right shoulder: Secondary | ICD-10-CM

## 2019-04-01 DIAGNOSIS — M7542 Impingement syndrome of left shoulder: Secondary | ICD-10-CM | POA: Diagnosis not present

## 2019-04-08 DIAGNOSIS — M25512 Pain in left shoulder: Secondary | ICD-10-CM | POA: Diagnosis not present

## 2019-04-08 DIAGNOSIS — M25612 Stiffness of left shoulder, not elsewhere classified: Secondary | ICD-10-CM | POA: Diagnosis not present

## 2019-04-09 ENCOUNTER — Other Ambulatory Visit: Payer: Self-pay

## 2019-04-09 ENCOUNTER — Encounter: Payer: Self-pay | Admitting: Physician Assistant

## 2019-04-09 ENCOUNTER — Ambulatory Visit (INDEPENDENT_AMBULATORY_CARE_PROVIDER_SITE_OTHER): Payer: BLUE CROSS/BLUE SHIELD | Admitting: Physician Assistant

## 2019-04-09 VITALS — BP 120/70 | HR 64 | Temp 97.1°F | Resp 16 | Ht 72.0 in | Wt 218.0 lb

## 2019-04-09 DIAGNOSIS — E663 Overweight: Secondary | ICD-10-CM | POA: Diagnosis not present

## 2019-04-09 DIAGNOSIS — E785 Hyperlipidemia, unspecified: Secondary | ICD-10-CM | POA: Diagnosis not present

## 2019-04-09 DIAGNOSIS — R739 Hyperglycemia, unspecified: Secondary | ICD-10-CM | POA: Diagnosis not present

## 2019-04-09 DIAGNOSIS — Z0000 Encounter for general adult medical examination without abnormal findings: Secondary | ICD-10-CM

## 2019-04-09 NOTE — Patient Instructions (Signed)
Health Maintenance After Age 56 After age 56, you are at a higher risk for certain long-term diseases and infections as well as injuries from falls. Falls are a major cause of broken bones and head injuries in people who are older than age 56. Getting regular preventive care can help to keep you healthy and well. Preventive care includes getting regular testing and making lifestyle changes as recommended by your health care provider. Talk with your health care provider about:  Which screenings and tests you should have. A screening is a test that checks for a disease when you have no symptoms.  A diet and exercise plan that is right for you. What should I know about screenings and tests to prevent falls? Screening and testing are the best ways to find a health problem early. Early diagnosis and treatment give you the best chance of managing medical conditions that are common after age 56. Certain conditions and lifestyle choices may make you more likely to have a fall. Your health care provider may recommend:  Regular vision checks. Poor vision and conditions such as cataracts can make you more likely to have a fall. If you wear glasses, make sure to get your prescription updated if your vision changes.  Medicine review. Work with your health care provider to regularly review all of the medicines you are taking, including over-the-counter medicines. Ask your health care provider about any side effects that may make you more likely to have a fall. Tell your health care provider if any medicines that you take make you feel dizzy or sleepy.  Osteoporosis screening. Osteoporosis is a condition that causes the bones to get weaker. This can make the bones weak and cause them to break more easily.  Blood pressure screening. Blood pressure changes and medicines to control blood pressure can make you feel dizzy.  Strength and balance checks. Your health care provider may recommend certain tests to check your  strength and balance while standing, walking, or changing positions.  Foot health exam. Foot pain and numbness, as well as not wearing proper footwear, can make you more likely to have a fall.  Depression screening. You may be more likely to have a fall if you have a fear of falling, feel emotionally low, or feel unable to do activities that you used to do.  Alcohol use screening. Using too much alcohol can affect your balance and may make you more likely to have a fall. What actions can I take to lower my risk of falls? General instructions  Talk with your health care provider about your risks for falling. Tell your health care provider if: ? You fall. Be sure to tell your health care provider about all falls, even ones that seem minor. ? You feel dizzy, sleepy, or off-balance.  Take over-the-counter and prescription medicines only as told by your health care provider. These include any supplements.  Eat a healthy diet and maintain a healthy weight. A healthy diet includes low-fat dairy products, low-fat (lean) meats, and fiber from whole grains, beans, and lots of fruits and vegetables. Home safety  Remove any tripping hazards, such as rugs, cords, and clutter.  Install safety equipment such as grab bars in bathrooms and safety rails on stairs.  Keep rooms and walkways well-lit. Activity   Follow a regular exercise program to stay fit. This will help you maintain your balance. Ask your health care provider what types of exercise are appropriate for you.  If you need a cane or   walker, use it as recommended by your health care provider.  Wear supportive shoes that have nonskid soles. Lifestyle  Do not drink alcohol if your health care provider tells you not to drink.  If you drink alcohol, limit how much you have: ? 0-1 drink a day for women. ? 0-2 drinks a day for men.  Be aware of how much alcohol is in your drink. In the [place] one drink equals one typical bottle of beer (12  oz), one-half glass of wine (5 oz), or one shot of hard liquor (1 oz).  Do not use any products that contain nicotine or tobacco, such as cigarettes and e-cigarettes. If you need help quitting, ask your health care provider. Summary  Having a healthy lifestyle and getting preventive care can help to protect your health and wellness after age 56.  Screening and testing are the best way to find a health problem early and help you avoid having a fall. Early diagnosis and treatment give you the best chance for managing medical conditions that are more common for people who are older than age 56.  Falls are a major cause of broken bones and head injuries in people who are older than age 56. Take precautions to prevent a fall at home.  Work with your health care provider to learn what changes you can make to improve your health and wellness and to prevent falls. This information is not intended to replace advice given to you by your health care provider. Make sure you discuss any questions you have with your health care provider. Document Released: 04/26/2017 Document Revised: 10/04/2018 Document Reviewed: 04/26/2017 Elsevier Patient Education  2020 [employer]  

## 2019-04-09 NOTE — Progress Notes (Signed)
Patient: Jose Khan, Male    DOB: [birth year], 58 y.o.   MRN: [id number] Visit Date: 04/09/2019  Today's Provider: Trinna Post, PA-C   Chief Complaint  Patient presents with  . Annual Exam   Subjective:     Annual physical exam KASHTEN Khan is a 58 y.o. male who presents today for health maintenance and complete physical. He feels well. He reports exercising yes. He reports he is sleeping fairly well.  Colonoscopy: 06/2018 two polyps, repeat 5 years PSA: 03/2018 normal - no family history  Wt Readings from Last 3 Encounters:  04/09/19 218 lb (98.9 kg)  03/07/19 222 lb (100.7 kg)  11/06/18 232 lb (105.2 kg)   Has been trying to lose weight with a group of three couples. Doing a combined 400 mile challenge for this month. Walking 5-8 miles per day.   HLD: Currently taking crestor 40 mg daily.   Lipid Panel     Component Value Date/Time   CHOL 186 11/06/2018 0845   TRIG 89 11/06/2018 0845   HDL 53 11/06/2018 0845   CHOLHDL 3.5 11/06/2018 0845   CHOLHDL 6.5 (H) 04/28/2017 0808   LDLCALC 115 (H) 11/06/2018 0845   LDLCALC 223 (H) 04/28/2017 0808   LABVLDL 18 11/06/2018 0845   In the interim he has seen Dr. Mack Guise  ----------------------------------------------------------   Review of Systems  All other systems reviewed and are negative.   Social History      He  reports that he has never smoked. He has never used smokeless tobacco. He reports current alcohol use of about 13.0 standard drinks of alcohol per week. He reports that he does not use drugs.       Social History   Socioeconomic History  . Marital status: Married    Spouse name: Not on file  . Number of children: Not on file  . Years of education: Not on file  . Highest education level: Not on file  Occupational History  . Not on file  Social Needs  . Financial resource strain: Not on file  . Food insecurity    Worry: Not on file    Inability: Not on file  . Transportation  needs    Medical: Not on file    Non-medical: Not on file  Tobacco Use  . Smoking status: Never Smoker  . Smokeless tobacco: Never Used  Substance and Sexual Activity  . Alcohol use: Yes    Alcohol/week: 13.0 standard drinks    Types: 7 Glasses of wine, 1 Cans of beer, 5 Shots of liquor per week  . Drug use: No  . Sexual activity: Not on file  Lifestyle  . Physical activity    Days per week: Not on file    Minutes per session: Not on file  . Stress: Not on file  Relationships  . Social Herbalist on phone: Not on file    Gets together: Not on file    Attends religious service: Not on file    Active member of club or organization: Not on file    Attends meetings of clubs or organizations: Not on file    Relationship status: Not on file  Other Topics Concern  . Not on file  Social History Narrative  . Not on file    History reviewed. No pertinent past medical history.   Patient Active Problem List   Diagnosis Date Noted  . Hx of colonic polyps   .  Polyp of sigmoid colon   . Problems with swallowing and mastication   . Stricture and stenosis of esophagus   . Duodenitis   . Tubular adenoma of colon 04/27/2017  . Dysphagia 04/27/2017  . Tear of meniscus of right knee 04/27/2017  . Hyperlipemia 01/07/2015  . Herpes 02/21/2007  . Familial hypercholesterolemia 06/27/2001    Past Surgical History:  Procedure Laterality Date  . COLONOSCOPY WITH PROPOFOL N/A 07/03/2018   Procedure: COLONOSCOPY WITH PROPOFOL;  Surgeon: Lucilla Lame, MD;  Location: [facility] ENDOSCOPY;  Service: Endoscopy;  Laterality: N/A;  . ESOPHAGOGASTRODUODENOSCOPY (EGD) WITH PROPOFOL N/A 07/03/2018   Procedure: ESOPHAGOGASTRODUODENOSCOPY (EGD) WITH PROPOFOL;  Surgeon: Lucilla Lame, MD;  Location: [facility] ENDOSCOPY;  Service: Endoscopy;  Laterality: N/A;  . KNEE ARTHROSCOPY Right     Family History        Family Status  Relation Name Status  . Mother  Alive  . Father  Alive  . MGM  (Not  Specified)        His family history includes Breast cancer in his mother; CAD in his father; Dementia in his father and maternal grandmother; Macular degeneration in his mother; Parkinson's disease in his mother.      No Known Allergies   Current Outpatient Medications:  .  rosuvastatin (CRESTOR) 40 MG tablet, TAKE 1 TABLET BY MOUTH DAILY, Disp: 90 tablet, Rfl: 1   Patient Care Team: Paulene Floor as PCP - General (Physician Assistant)    Objective:    Vitals: BP 120/70 (BP Location: Right Arm, Patient Position: Sitting, Cuff Size: Large)   Pulse 64   Temp (!) 97.1 F (36.2 C) (Other (Comment))   Resp 16   Ht 6' (1.829 m)   Wt 218 lb (98.9 kg)   SpO2 97%   BMI 29.57 kg/m    Vitals:   04/09/19 1011  BP: 120/70  Pulse: 64  Resp: 16  Temp: (!) 97.1 F (36.2 C)  TempSrc: Other (Comment)  SpO2: 97%  Weight: 218 lb (98.9 kg)  Height: 6' (1.829 m)     Physical Exam Constitutional:      Appearance: Normal appearance.  Cardiovascular:     Rate and Rhythm: Normal rate and regular rhythm.     Heart sounds: Normal heart sounds.  Pulmonary:     Effort: Pulmonary effort is normal.     Breath sounds: Normal breath sounds.  Abdominal:     General: Bowel sounds are normal.     Palpations: Abdomen is soft.     Tenderness: There is no abdominal tenderness.  Skin:    General: Skin is warm and dry.  Neurological:     Mental Status: He is alert and oriented to person, place, and time. Mental status is at baseline.  Psychiatric:        Mood and Affect: Mood normal.        Behavior: Behavior normal.      Depression Screen PHQ 2/9 Scores 04/09/2019 04/17/2018 04/27/2017 04/27/2017  PHQ - 2 Score 0 0 0 0  PHQ- 9 Score 0 1 - 1       Assessment & Plan:     Routine Health Maintenance and Physical Exam  Exercise Activities and Dietary recommendations Goals   None     Immunization History  Administered Date(s) Administered  . Hepatitis A, Adult  08/25/2010  . Hepatitis B, adult 06/07/2018, 07/10/2018, 11/06/2018  . Influenza,inj,Quad PF,6+ Mos 04/27/2017, 04/17/2018, 03/07/2019  . Measles 02/18/1988  . Rubella 02/18/1988  .  Td 04/27/2017  . Tdap 02/21/2007    Health Maintenance  Topic Date Due  . COLONOSCOPY  07/04/2023  . TETANUS/TDAP  04/28/2027  . INFLUENZA VACCINE  Completed  . Hepatitis C Screening  Completed  . HIV Screening  Completed     Discussed health benefits of physical activity, and encouraged him to engage in regular exercise appropriate for his age and condition.    1. Annual physical exam  - Comprehensive Metabolic Panel (CMET) - Lipid Profile - TSH - CBC with Differential  2. Hyperlipidemia, unspecified hyperlipidemia type  Taking crestor. Has lost 14 lbs since May. Check labs above for improvement in liver enzymes and cholesterol.  3. Overweight  The entirety of the information documented in the History of Present Illness, Review of Systems and Physical Exam were personally obtained by me. Portions of this information were initially documented by April M. Sabra Heck, CMA and reviewed by me for thoroughness and accuracy.   F/u 1 year CPE  --------------------------------------------------------------------    Trinna Post, PA-C  [facility]

## 2019-04-10 LAB — CBC WITH DIFFERENTIAL/PLATELET
Basophils Absolute: 0 10*3/uL (ref 0.0–0.2)
Basos: 1 %
EOS (ABSOLUTE): 0.1 10*3/uL (ref 0.0–0.4)
Eos: 1 %
Hematocrit: 45.5 % (ref 37.5–51.0)
Hemoglobin: 15.1 g/dL (ref 13.0–17.7)
Immature Grans (Abs): 0 10*3/uL (ref 0.0–0.1)
Immature Granulocytes: 0 %
Lymphocytes Absolute: 1.5 10*3/uL (ref 0.7–3.1)
Lymphs: 37 %
MCH: 31.2 pg (ref 26.6–33.0)
MCHC: 33.2 g/dL (ref 31.5–35.7)
MCV: 94 fL (ref 79–97)
Monocytes Absolute: 0.3 10*3/uL (ref 0.1–0.9)
Monocytes: 8 %
Neutrophils Absolute: 2.2 10*3/uL (ref 1.4–7.0)
Neutrophils: 53 %
Platelets: 141 10*3/uL — ABNORMAL LOW (ref 150–450)
RBC: 4.84 x10E6/uL (ref 4.14–5.80)
RDW: 12.8 % (ref 11.6–15.4)
WBC: 4.1 10*3/uL (ref 3.4–10.8)

## 2019-04-10 LAB — COMPREHENSIVE METABOLIC PANEL
ALT: 32 IU/L (ref 0–44)
AST: 26 IU/L (ref 0–40)
Albumin/Globulin Ratio: 1.9 (ref 1.2–2.2)
Albumin: 4.6 g/dL (ref 3.8–4.9)
Alkaline Phosphatase: 57 IU/L (ref 39–117)
BUN/Creatinine Ratio: 21 — ABNORMAL HIGH (ref 9–20)
BUN: 22 mg/dL (ref 6–24)
Bilirubin Total: 0.8 mg/dL (ref 0.0–1.2)
CO2: 22 mmol/L (ref 20–29)
Calcium: 9.3 mg/dL (ref 8.7–10.2)
Chloride: 101 mmol/L (ref 96–106)
Creatinine, Ser: 1.04 mg/dL (ref 0.76–1.27)
GFR calc Af Amer: 92 mL/min/{1.73_m2} (ref >59)
GFR calc non Af Amer: 80 mL/min/{1.73_m2} (ref >59)
Globulin, Total: 2.4 g/dL (ref 1.5–4.5)
Glucose: 101 mg/dL — ABNORMAL HIGH (ref 65–99)
Potassium: 4.2 mmol/L (ref 3.5–5.2)
Sodium: 138 mmol/L (ref 134–144)
Total Protein: 7 g/dL (ref 6.0–8.5)

## 2019-04-10 LAB — LIPID PANEL
Chol/HDL Ratio: 3.5 ratio (ref 0.0–5.0)
Cholesterol, Total: 198 mg/dL (ref 100–199)
HDL: 56 mg/dL (ref >39)
LDL Chol Calc (NIH): 124 mg/dL — ABNORMAL HIGH (ref 0–99)
Triglycerides: 102 mg/dL (ref 0–149)
VLDL Cholesterol Cal: 18 mg/dL (ref 5–40)

## 2019-04-10 LAB — TSH: TSH: 2.34 u[IU]/mL (ref 0.450–4.500)

## 2019-04-15 ENCOUNTER — Encounter: Payer: Self-pay | Admitting: Physician Assistant

## 2019-04-17 DIAGNOSIS — M25612 Stiffness of left shoulder, not elsewhere classified: Secondary | ICD-10-CM | POA: Diagnosis not present

## 2019-04-17 DIAGNOSIS — M25512 Pain in left shoulder: Secondary | ICD-10-CM | POA: Diagnosis not present

## 2019-04-18 LAB — SPECIMEN STATUS REPORT

## 2019-04-18 LAB — HGB A1C W/O EAG: Hgb A1c MFr Bld: 5.2 % (ref 4.8–5.6)

## 2019-04-24 DIAGNOSIS — M25512 Pain in left shoulder: Secondary | ICD-10-CM | POA: Diagnosis not present

## 2019-04-24 DIAGNOSIS — M25612 Stiffness of left shoulder, not elsewhere classified: Secondary | ICD-10-CM | POA: Diagnosis not present

## 2019-05-03 DIAGNOSIS — M25512 Pain in left shoulder: Secondary | ICD-10-CM | POA: Diagnosis not present

## 2019-05-03 DIAGNOSIS — M25612 Stiffness of left shoulder, not elsewhere classified: Secondary | ICD-10-CM | POA: Diagnosis not present

## 2019-05-08 DIAGNOSIS — M25612 Stiffness of left shoulder, not elsewhere classified: Secondary | ICD-10-CM | POA: Diagnosis not present

## 2019-05-08 DIAGNOSIS — M25512 Pain in left shoulder: Secondary | ICD-10-CM | POA: Diagnosis not present

## 2019-05-20 ENCOUNTER — Other Ambulatory Visit: Payer: Self-pay | Admitting: *Deleted

## 2019-05-20 DIAGNOSIS — Z20822 Contact with and (suspected) exposure to covid-19: Secondary | ICD-10-CM

## 2019-05-22 LAB — NOVEL CORONAVIRUS, NAA: SARS-CoV-2, NAA: NOT DETECTED

## 2019-06-04 ENCOUNTER — Other Ambulatory Visit: Payer: Self-pay

## 2019-06-04 DIAGNOSIS — Z20822 Contact with and (suspected) exposure to covid-19: Secondary | ICD-10-CM

## 2019-06-06 LAB — NOVEL CORONAVIRUS, NAA: SARS-CoV-2, NAA: NOT DETECTED

## 2019-06-07 DIAGNOSIS — L718 Other rosacea: Secondary | ICD-10-CM | POA: Diagnosis not present

## 2019-06-07 DIAGNOSIS — D2262 Melanocytic nevi of left upper limb, including shoulder: Secondary | ICD-10-CM | POA: Diagnosis not present

## 2019-06-07 DIAGNOSIS — D2272 Melanocytic nevi of left lower limb, including hip: Secondary | ICD-10-CM | POA: Diagnosis not present

## 2019-06-07 DIAGNOSIS — D2261 Melanocytic nevi of right upper limb, including shoulder: Secondary | ICD-10-CM | POA: Diagnosis not present

## 2019-09-24 ENCOUNTER — Other Ambulatory Visit: Payer: Self-pay | Admitting: Physician Assistant

## 2019-09-24 DIAGNOSIS — E785 Hyperlipidemia, unspecified: Secondary | ICD-10-CM

## 2019-09-24 NOTE — Telephone Encounter (Signed)
Requested Prescriptions  Pending Prescriptions Disp Refills  . rosuvastatin (CRESTOR) 40 MG tablet [Pharmacy Med Name: ROSUVASTATIN CALCIUM 40 MG TAB] 90 tablet 1    Sig: TAKE 1 TABLET BY MOUTH DAILY     Cardiovascular:  Antilipid - Statins Failed - 09/24/2019 10:12 AM      Failed - LDL in normal range and within 360 days    LDL Cholesterol (Calc)  Date Value Ref Range Status  04/28/2017 223 (H) mg/dL (calc) Final    Comment:    LDL-C levels > or = 190 mg/dL may indicate familial  hypercholesterolemia (FH). Clinical assessment and  measurement of blood lipid levels should be  considered for all first degree relatives of  patients with an FH diagnosis.  For questions about testing for familial hypercholesterolemia, please call Insurance risk surveyor at [email or web address]. Duncan Dull, et al. J National Lipid Association  Recommendations for Patient-Centered Management of  Dyslipidemia: Part 1 Journal of Clinical Lipidology  2015;9(2), 129-169. Reference range: <100 . Desirable range <100 mg/dL for primary prevention;   <70 mg/dL for patients with CHD or diabetic patients  with > or = 2 CHD risk factors. Marland Kitchen LDL-C is now calculated using the Martin-Hopkins  calculation, which is a validated novel method providing  better accuracy than the Friedewald equation in the  estimation of LDL-C.  Cresenciano Genre et al. Annamaria Helling. MU:[id number]): 2061-2068  (http://education.Quest[employer]ostics.com/faq/FAQ164)    LDL Chol Calc (NIH)  Date Value Ref Range Status  04/09/2019 124 (H) 0 - 99 mg/dL Final         Passed - Total Cholesterol in normal range and within 360 days    Cholesterol, Total  Date Value Ref Range Status  04/09/2019 198 100 - 199 mg/dL Final         Passed - HDL in normal range and within 360 days    HDL  Date Value Ref Range Status  04/09/2019 56 >39 mg/dL Final         Passed - Triglycerides in normal range and within 360 days    Triglycerides  Date Value Ref Range  Status  04/09/2019 102 0 - 149 mg/dL Final         Passed - Patient is not pregnant      Passed - Valid encounter within last 12 months    Recent Outpatient Visits          5 months ago Annual physical exam   [place], [place], PA-C   6 months ago Impingement syndrome of left shoulder   [place], [place], [place]   10 months ago Hyperlipidemia, unspecified hyperlipidemia type   [facility] [place], [place], [place]   1 year ago Familial hypercholesterolemia   [place], [place], PA-C   1 year ago Hyperlipidemia, unspecified hyperlipidemia type   [place], [place], [place]

## 2019-11-12 IMAGING — US US ABDOMEN LIMITED
1 series · 14 of 25 positions shown · non-contrast
Comparison: None.

CLINICAL DATA: 57-year-old male with elevated LFTs.

EXAM:
ULTRASOUND ABDOMEN LIMITED RIGHT UPPER QUADRANT

[Series 1: us abdomen limited · 0.23mm/px · 14 of 55 slices shown]
[im 1/55]
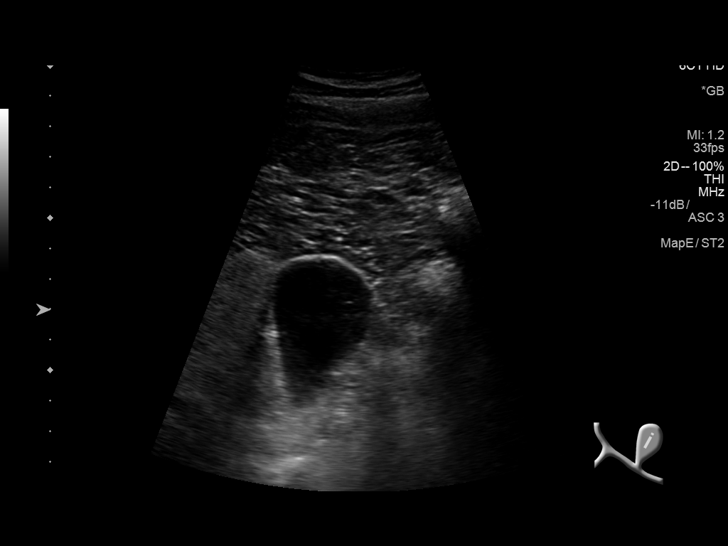
[im 5/55]
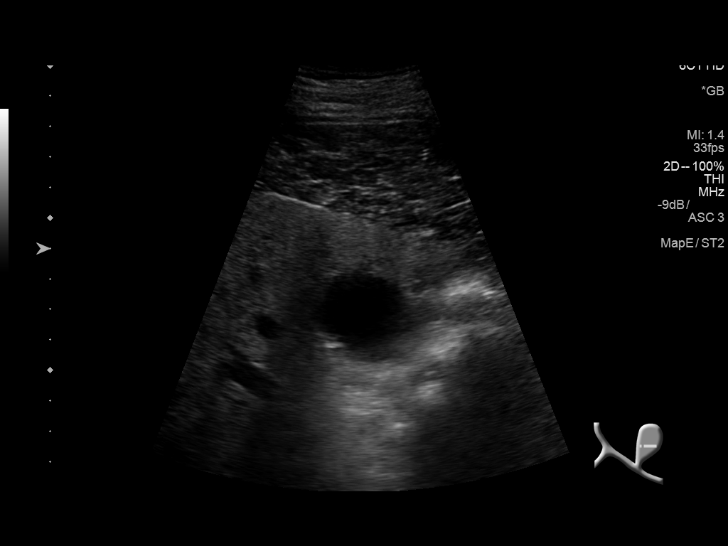
[im 10/55]
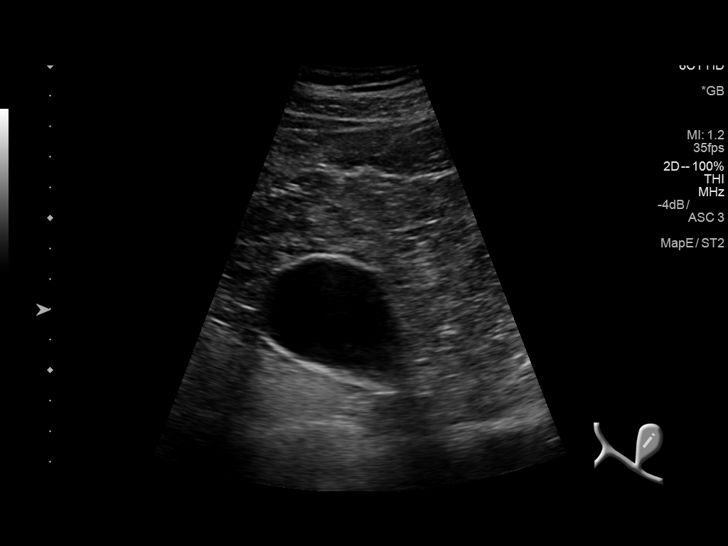
[im 14/55]
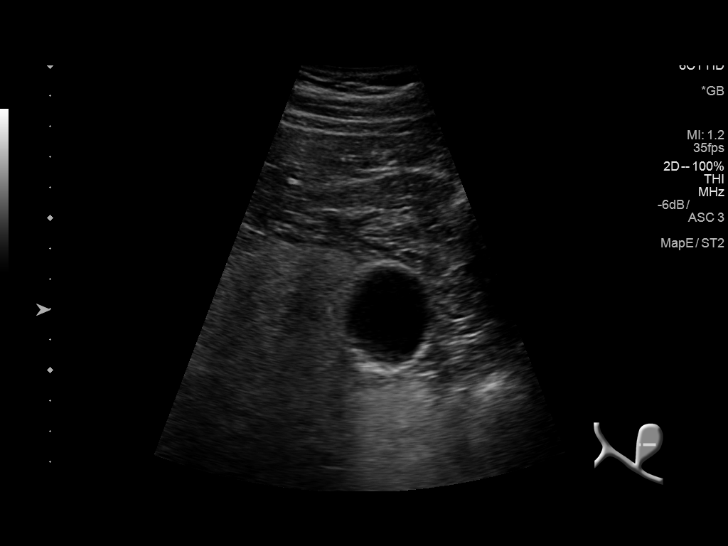
[im 19/55]
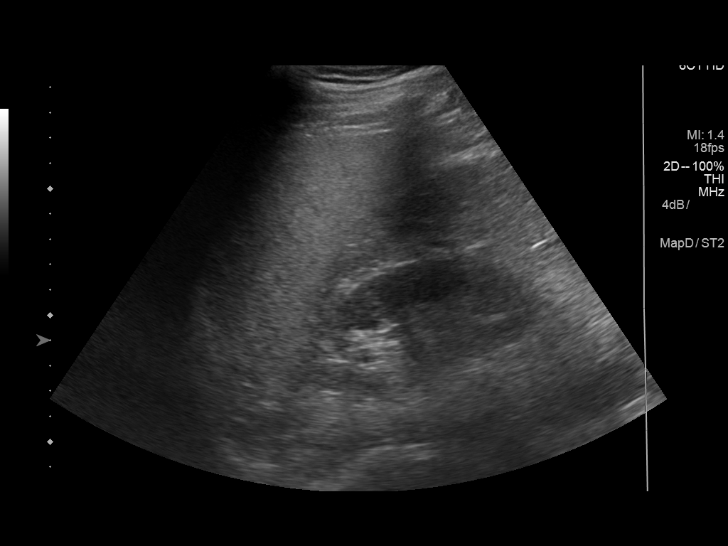
[im 21/55]
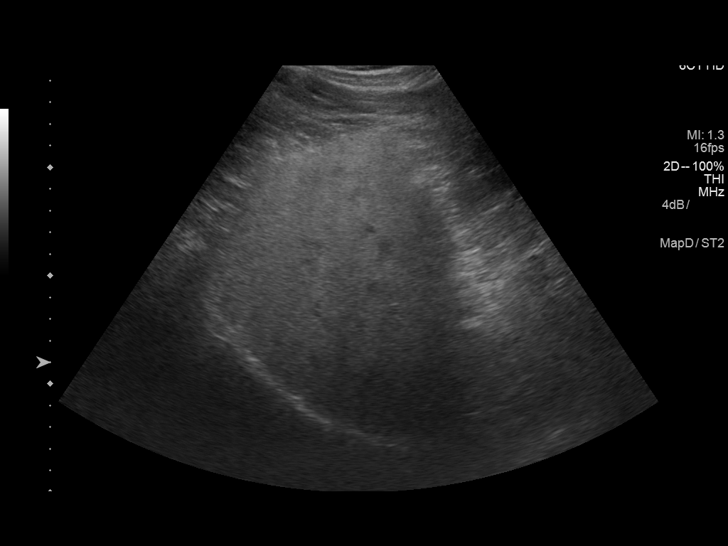
[im 25/55]
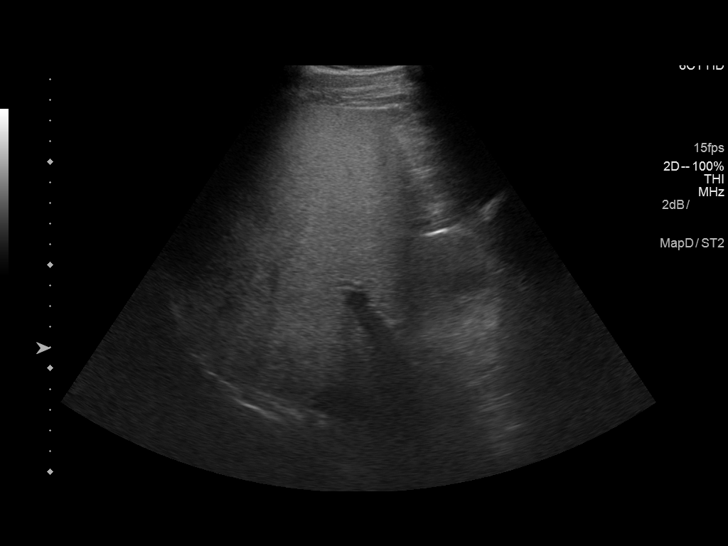
[im 30/55]
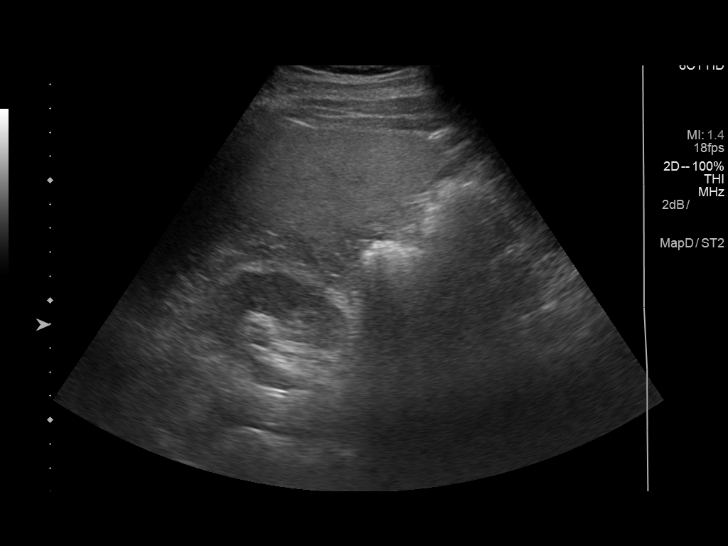
[im 34/55]
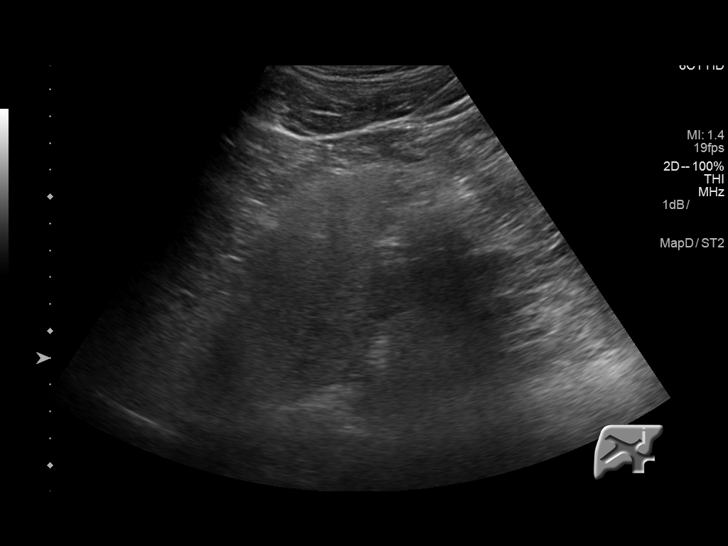
[im 37/55]
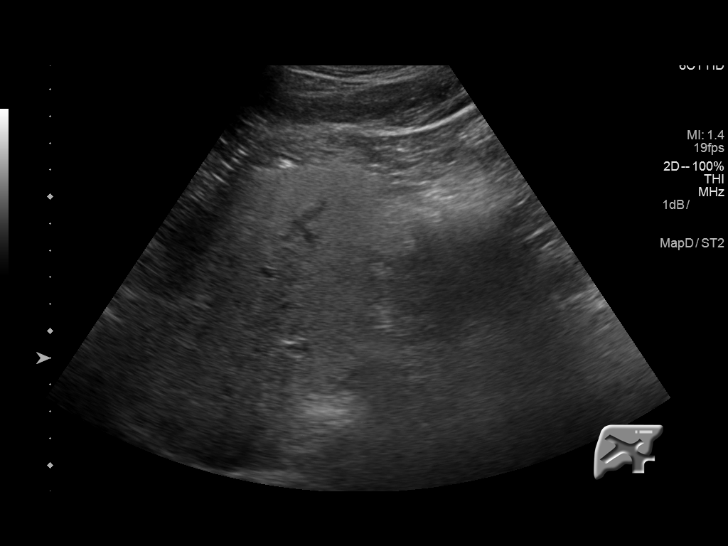
[im 41/55]
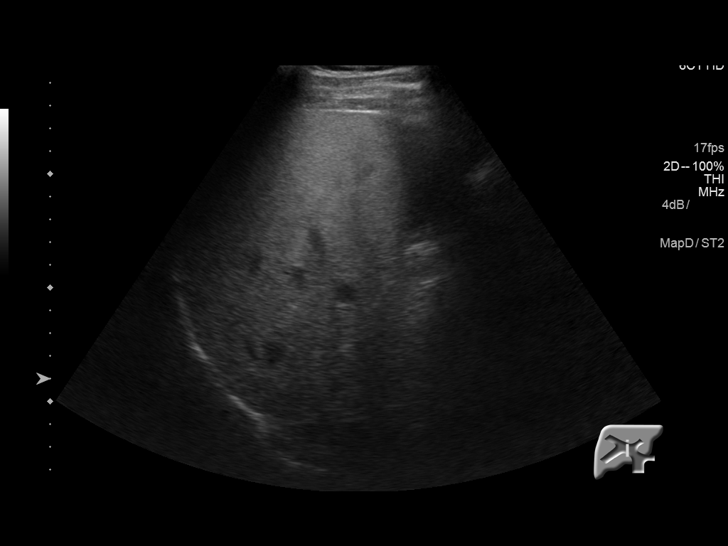
[im 46/55]
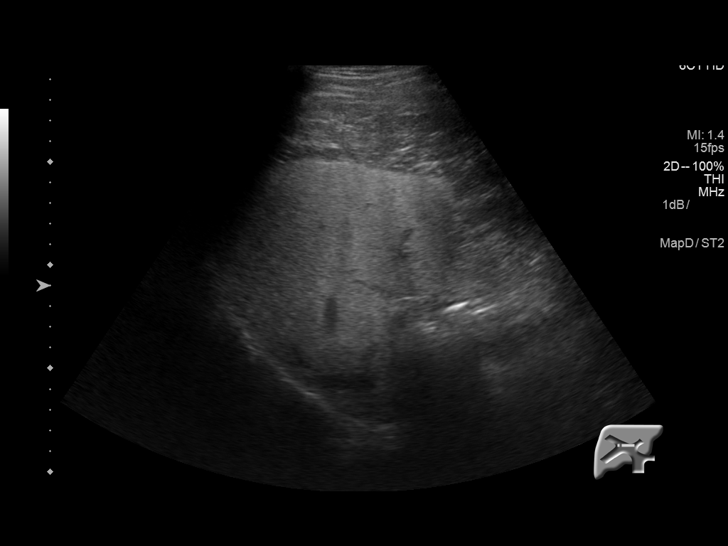
[im 50/55]
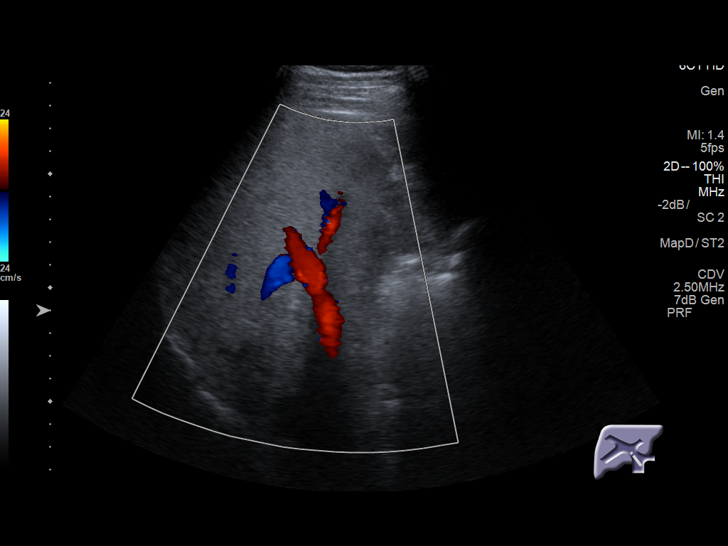
[im 55/55]
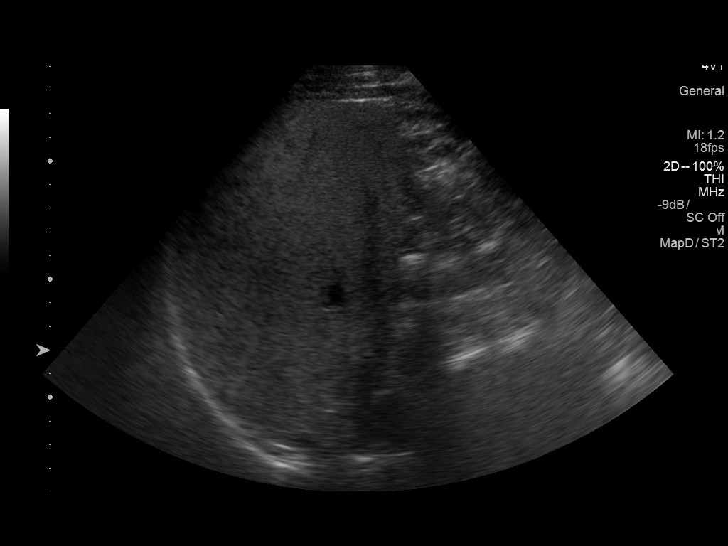

[14 of 25 positions shown; findings below may reference images not displayed]

FINDINGS: Gallbladder:

The gallbladder is unremarkable. There is no evidence of
cholelithiasis or acute cholecystitis.

Common bile duct:

Diameter: 2.3 mm. No intrahepatic or extrahepatic biliary
dilatation.

Liver:

Diffusely increased hepatic echogenicity noted compatible with
hepatic steatosis. No other hepatic abnormalities are noted. Portal
vein is patent on color Doppler imaging with normal direction of
blood flow towards the liver.
IMPRESSION: 1. Hepatic steatosis.
2. Unremarkable gallbladder.  No biliary dilatation.

## 2019-12-25 ENCOUNTER — Ambulatory Visit: Payer: No Typology Code available for payment source | Admitting: Physician Assistant

## 2019-12-25 ENCOUNTER — Other Ambulatory Visit: Payer: Self-pay

## 2019-12-25 ENCOUNTER — Encounter: Payer: Self-pay | Admitting: Physician Assistant

## 2019-12-25 VITALS — BP 134/88 | HR 64 | Temp 98.1°F | Resp 16 | Ht 73.0 in | Wt 220.0 lb

## 2019-12-25 DIAGNOSIS — Z4802 Encounter for removal of sutures: Secondary | ICD-10-CM

## 2019-12-25 DIAGNOSIS — S0990XA Unspecified injury of head, initial encounter: Secondary | ICD-10-CM | POA: Diagnosis not present

## 2019-12-25 NOTE — Progress Notes (Signed)
     Established patient visit   Patient: Jose Khan   DOB: 04-Jan-1961   59 y.o. Male  MRN: [id number] Visit Date: 12/25/2019  Today's healthcare provider: Trinna Post, PA-C   Chief Complaint  Patient presents with  . Head Injury   Subjective    HPI Patient comes in today for a  Suture removal on his head. He reports that he injured his head on 12/16/2019. He reports getting 1 stable in the head in the ER in [place]. CT scan was negative. No fevers, chills, N/V.     Medications: Outpatient Medications Prior to Visit  Medication Sig  . [DISCONTINUED] rosuvastatin (CRESTOR) 40 MG tablet TAKE 1 TABLET BY MOUTH DAILY   No facility-administered medications prior to visit.    Review of Systems  Constitutional: Negative.   Respiratory: Negative.   Cardiovascular: Negative.   Musculoskeletal: Negative.   Neurological: Negative.       Objective    BP 134/88 (BP Location: Right Arm)   Pulse 64   Temp 98.1 F (36.7 C)   Resp 16   Ht 6\' 1"  (1.854 m)   Wt 220 lb (99.8 kg)   BMI 29.03 kg/m    Physical Exam Constitutional:      Appearance: Normal appearance.  HENT:     Head:   Skin:    General: Skin is warm and dry.  Neurological:     Mental Status: He is alert and oriented to person, place, and time. Mental status is at baseline.  Psychiatric:        Mood and Affect: Mood normal.        Behavior: Behavior normal.       No results found for any visits on 12/25/19.  Assessment & Plan    1. Injury of head, initial encounter   2. Encounter for staple removal Single staple removed without issue.     Return if symptoms worsen or fail to improve.      ITrinna Post, PA-C, have reviewed all documentation for this visit. The documentation on 12/31/19 for the exam, diagnosis, procedures, and orders are all accurate and complete.    Paulene Floor  [facility] [phone] (phone) [phone] (fax)  [place]

## 2019-12-26 ENCOUNTER — Other Ambulatory Visit: Payer: Self-pay | Admitting: Physician Assistant

## 2019-12-26 DIAGNOSIS — E785 Hyperlipidemia, unspecified: Secondary | ICD-10-CM

## 2019-12-31 NOTE — Patient Instructions (Signed)
Head Injury, Adult There are many types of head injuries. They can be as minor as a bump. Some head injuries can be worse. Worse injuries include:  A strong hit to the head that shakes the brain back and forth causing damage (concussion).  A bruise (contusion) of the brain. This means there is bleeding in the brain that can cause swelling.  A cracked skull (skull fracture).  Bleeding in the brain that gathers, gets thick (makes a clot), and forms a bump (hematoma). Most problems from a head injury come in the first 24 hours. However, you may still have side effects up to 7-10 days after your injury. It is important to watch your condition for any changes. You may need to be watched in the emergency department or urgent care, or you may need to stay in the hospital. What are the causes? There are many possible causes of a head injury. A serious head injury may be caused by:  A car accident.  Bicycle or motorcycle accidents.  Sports injuries.  Falls. What are the signs or symptoms? Symptoms of a head injury include a bruise, bump, or bleeding where the injury happened. Other physical symptoms may include:  Headache.  Feeling sick to your stomach (nauseous) or vomiting.  Dizziness.  Feeling tired.  Being uncomfortable around bright lights or loud noises.  Shaking movements that you cannot control (seizures).  Trouble being woken up.  Passing out (fainting). Mental or emotional symptoms may include:  Feeling grumpy or cranky.  Confusion and memory problems.  Having trouble paying attention or concentrating.  Changes in eating or sleeping habits.  Feeling worried or nervous (anxious).  Feeling sad (depressed). How is this treated? Treatment for this condition depends on how severe the injury is and the type of injury you have. The main goal is to prevent complications and to allow the brain time to heal. Mild head injury If you have a mild head injury, you may be  sent home and treatment may include:  Being watched. A responsible adult should stay with you for 24 hours after your injury and check on you often.  Physical rest.  Brain rest.  Pain medicines. Severe head injury If you have a severe head injury, treatment may include:  Being watched closely. This includes hospitalization with frequent physical exams.  Medicines to: ? Help with pain. ? Prevent shaking movements that you cannot control. ? Help with brain swelling.  Using a machine that helps you breathe (ventilator).  Treatments to manage the swelling inside the brain.  Brain surgery. This may be needed to: ? Remove a blood clot. ? Stop the bleeding. ? Remove a part of the skull. This allows room for the brain to swell. Follow these instructions at home: Activity  Rest.  Avoid activities that are hard or tiring.  Make sure you get enough sleep.  Limit activities that need a lot of thought or attention, such as: ? Watching TV. ? Playing memory games and puzzles. ? Job-related work or homework. ? Working on the computer, social media, and texting.  Avoid activities that could cause another head injury until your doctor says it is okay. This includes playing sports. Having another head injury, especially before the first one has healed, can be dangerous.  Ask your doctor when it is safe for you to go back to your normal activities, such as work or school. Ask your doctor for a step-by-step plan for slowly going back to your normal activities.  Ask   your doctor when you can drive, ride a bicycle, or use heavy machinery. Do not do these activities if you are dizzy. Lifestyle   Do not drink alcohol until your doctor says it is okay.  Do not use drugs.  If it is harder than usual to remember things, write them down.  If you are easily distracted, try to do one thing at a time.  Talk with family members or close friends when making important decisions.  Tell your  friends, family, a trusted coworker, and work manager about your injury, symptoms, and limits (restrictions). Have them watch for any problems that are new or getting worse. General instructions  Take over-the-counter and prescription medicines only as told by your doctor.  Have someone stay with you for 24 hours after your head injury. This person should watch you for any changes in your symptoms and be ready to get help.  Keep all follow-up visits as told by your doctor. This is important. How is this prevented?  Work on your balance and strength. This can help you avoid falls.  Wear a seatbelt when you are in a moving vehicle.  Wear a helmet when you: ? Ride a bicycle. ? Ski. ? Do any other sport or activity that has a risk of injury.  If you drink alcohol: ? Limit how much you use to:  0-1 drink a day for women.  0-2 drinks a day for men. ? Be aware of how much alcohol is in your drink. In the [place] one drink equals one 12 oz bottle of beer (355 mL), one 5 oz glass of wine (148 mL), or one 1 oz glass of hard liquor (44 mL).  Make your home safer by: ? Getting rid of clutter from the floors and stairs. This includes things that can make you trip. ? Using grab bars in bathrooms and handrails by stairs. ? Placing non-slip mats on floors and in bathtubs. ? Putting more light in dim areas. Get help right away if:  You have: ? A very bad headache that is not helped by medicine. ? Trouble walking or weakness in your arms and legs. ? Clear or bloody fluid coming from your nose or ears. ? Changes in how you see (vision). ? Shaking movements that you cannot control.  You lose your balance.  You vomit.  The black centers of your eyes (pupils) change in size.  Your speech is slurred.  Your dizziness gets worse.  You pass out.  You are sleepier than normal and have trouble staying awake.  Your symptoms get worse. These symptoms may be an emergency. Do not wait to see  if the symptoms will go away. Get medical help right away. Call your local emergency services (911 in the [place] Do not drive yourself to the hospital. Summary  There are many types of head injuries. They can be as minor as a bump. Some head injuries can be worse  Treatment for this condition depends on how severe the injury is and the type of injury you have.  Ask your doctor when it is safe for you to go back to your normal activities, such as work or school.  To prevent a head injury, wear a seat belt in a car, wear a helmet when you use a a bicycle, limit your alcohol use, and make your home safer. This information is not intended to replace advice given to you by your health care provider. Make sure you discuss any questions you   have with your health care provider. Document Revised: 10/04/2018 Document Reviewed: 07/06/2018 Elsevier Patient Education  2020 Elsevier Inc.  

## 2020-05-12 ENCOUNTER — Other Ambulatory Visit: Payer: Self-pay

## 2020-05-12 ENCOUNTER — Ambulatory Visit: Payer: No Typology Code available for payment source | Admitting: Physician Assistant

## 2020-05-12 ENCOUNTER — Encounter: Payer: Self-pay | Admitting: Physician Assistant

## 2020-05-12 VITALS — BP 131/89 | HR 60 | Temp 97.9°F | Wt 224.6 lb

## 2020-05-12 DIAGNOSIS — Z23 Encounter for immunization: Secondary | ICD-10-CM | POA: Diagnosis not present

## 2020-05-12 DIAGNOSIS — R21 Rash and other nonspecific skin eruption: Secondary | ICD-10-CM | POA: Diagnosis not present

## 2020-05-12 NOTE — Progress Notes (Signed)
     Established patient visit   Patient: Jose Khan   DOB: 14-Jul-1960   59 y.o. Male  MRN: [id number] Visit Date: 05/12/2020  Today's healthcare provider: Trinna Post, PA-C   Chief Complaint  Patient presents with  . Rash  I,Ardyth Kelso M Marcello Tuzzolino,acting as a scribe for [employer], PA-C.,have documented all relevant documentation on the behalf of Trinna Post, PA-C,as directed by  Trinna Post, PA-C while in the presence of Trinna Post, PA-C.  Subjective    Rash This is a new problem. The current episode started 1 to 4 weeks ago. The problem is unchanged. The affected locations include the right arm. The rash is characterized by burning, pain and redness. Past treatments include moisturizer, antibiotic cream and anti-itch cream. The treatment provided mild relief.  He did not feel pain prior to the rash. He did have some swelling related to the rash. Denies fluid filled blisters. Denies itching. He reports rash has improved. He was moving some firewood the past weekend and thinks possibly something could have bitten him.      Medications: Outpatient Medications Prior to Visit  Medication Sig  . rosuvastatin (CRESTOR) 40 MG tablet TAKE 1 TABLET BY MOUTH DAILY   No facility-administered medications prior to visit.    Review of Systems  Skin: Positive for rash.      Objective    BP 131/89 (BP Location: Left Arm, Patient Position: Sitting, Cuff Size: Large)   Pulse 60   Temp 97.9 F (36.6 C) (Oral)   Wt 224 lb 9.6 oz (101.9 kg)   SpO2 100%   BMI 29.63 kg/m    Physical Exam Constitutional:      Appearance: Normal appearance.  Skin:    General: Skin is warm and dry.       Neurological:     General: No focal deficit present.     Mental Status: He is alert and oriented to person, place, and time.  Psychiatric:        Mood and Affect: Mood normal.        Behavior: Behavior normal.       No results found for any visits on 05/12/20.   Assessment & Plan    1. Rash  Healing lesions, uncertain etiology. Possibly insect bite, contact dermatitis. Can use hydrocortisone cream for any itching but this will likely resolve on its own. Follow up for CPE and chronic.  2. Need for influenza vaccination  - Flu Vaccine QUAD 36+ mos IM   No follow-ups on file.      ITrinna Post, PA-C, have reviewed all documentation for this visit. The documentation on 05/12/20 for the exam, diagnosis, procedures, and orders are all accurate and complete.  The entirety of the information documented in the History of Present Illness, Review of Systems and Physical Exam were personally obtained by me. Portions of this information were initially documented by [facility] and reviewed by me for thoroughness and accuracy.     Paulene Floor  [facility] [phone] (phone) [phone] (fax)  [place]

## 2020-06-12 ENCOUNTER — Encounter: Payer: No Typology Code available for payment source | Admitting: Physician Assistant

## 2020-06-29 ENCOUNTER — Other Ambulatory Visit: Payer: Self-pay | Admitting: Physician Assistant

## 2020-06-29 DIAGNOSIS — E785 Hyperlipidemia, unspecified: Secondary | ICD-10-CM

## 2020-07-01 NOTE — Progress Notes (Signed)
Complete physical exam   Patient: Jose Khan   DOB: 12/20/1960   60 y.o. Male  MRN: [id number] Visit Date: 07/02/2020  Today's healthcare provider: Trinna Post, PA-C   Chief Complaint  Patient presents with  . Annual Exam  I,Jose Khan M Jose Khan,acting as a scribe for Trinna Post, PA-C.,have documented all relevant documentation on the behalf of Trinna Post, PA-C,as directed by  Trinna Post, PA-C while in the presence of Trinna Post, PA-C.  Subjective    Jose Khan is a 60 y.o. male who presents today for a complete physical exam.  He reports consuming a healthy diet. Home exercise routine includes crossfit,walking and running. He generally feels well. He reports sleeping fairly well. He does have additional problems to discuss today.  HPI   Wt Readings from Last 3 Encounters:  07/02/20 220 lb 14.4 oz (100.2 kg)  05/12/20 224 lb 9.6 oz (101.9 kg)  12/25/19 220 lb (99.8 kg)   Lipid/Cholesterol, Follow-up  Last lipid panel Other pertinent labs  Lab Results  Component Value Date   CHOL 198 04/09/2019   HDL 56 04/09/2019   LDLCALC 124 (H) 04/09/2019   TRIG 102 04/09/2019   CHOLHDL 3.5 04/09/2019   Lab Results  Component Value Date   ALT 32 04/09/2019   AST 26 04/09/2019   PLT 141 (L) 04/09/2019   TSH 2.340 04/09/2019     He was last seen for this 2 months ago.  Management since that visit includes continue crestor 40 mg QHS.   He reports good compliance with treatment. He is not having side effects.   Symptoms: No chest pain No chest pressure/discomfort  No dyspnea No lower extremity edema  No numbness or tingling of extremity No orthopnea  No palpitations No paroxysmal nocturnal dyspnea  No speech difficulty No syncope   Current diet: well balanced Current exercise: none  The 10-year ASCVD risk score Mikey Bussing DC Jr., et al., 2013) is:  6.1%  ---------------------------------------------------------------------------------------------------  No past medical history on file. Past Surgical History:  Procedure Laterality Date  . COLONOSCOPY WITH PROPOFOL N/A 07/03/2018   Procedure: COLONOSCOPY WITH PROPOFOL;  Surgeon: Lucilla Lame, MD;  Location: [facility] ENDOSCOPY;  Service: Endoscopy;  Laterality: N/A;  . ESOPHAGOGASTRODUODENOSCOPY (EGD) WITH PROPOFOL N/A 07/03/2018   Procedure: ESOPHAGOGASTRODUODENOSCOPY (EGD) WITH PROPOFOL;  Surgeon: Lucilla Lame, MD;  Location: [facility] ENDOSCOPY;  Service: Endoscopy;  Laterality: N/A;  . KNEE ARTHROSCOPY Right    Social History   Socioeconomic History  . Marital status: Married    Spouse name: Not on file  . Number of children: Not on file  . Years of education: Not on file  . Highest education level: Not on file  Occupational History  . Not on file  Tobacco Use  . Smoking status: Never Smoker  . Smokeless tobacco: Never Used  Vaping Use  . Vaping Use: Never used  Substance and Sexual Activity  . Alcohol use: Yes    Alcohol/week: 13.0 standard drinks    Types: 7 Glasses of wine, 1 Cans of beer, 5 Shots of liquor per week  . Drug use: No  . Sexual activity: Not on file  Other Topics Concern  . Not on file  Social History Narrative  . Not on file   Social Determinants of Health   Financial Resource Strain: Not on file  Food Insecurity: Not on file  Transportation Needs: Not on file  Physical Activity: Not on file  Stress:  Not on file  Social Connections: Not on file  Intimate Partner Violence: Not on file   Family Status  Relation Name Status  . Mother  Alive  . Father  Alive  . MGM  (Not Specified)   Family History  Problem Relation Age of Onset  . Parkinson's disease Mother   . Macular degeneration Mother   . Breast cancer Mother   . Dementia Father   . CAD Father   . Dementia Maternal Grandmother    No Known Allergies  Patient Care Team: Paulene Floor as PCP - General (Physician Assistant)   Medications: Outpatient Medications Prior to Visit  Medication Sig  . rosuvastatin (CRESTOR) 40 MG tablet TAKE 1 TABLET BY MOUTH DAILY   No facility-administered medications prior to visit.    Review of Systems    Objective    BP 129/78 (BP Location: Left Arm, Patient Position: Sitting, Cuff Size: Large)   Pulse 68   Temp 98.5 F (36.9 C) (Oral)   Ht 6' (1.829 m)   Wt 220 lb 14.4 oz (100.2 kg)   SpO2 99%   BMI 29.96 kg/m    Physical Exam Constitutional:      Appearance: Normal appearance.  Cardiovascular:     Rate and Rhythm: Normal rate and regular rhythm.     Heart sounds: Normal heart sounds.  Pulmonary:     Effort: Pulmonary effort is normal.     Breath sounds: Normal breath sounds.  Abdominal:     General: Bowel sounds are normal.     Palpations: Abdomen is soft.  Skin:    General: Skin is warm and dry.  Neurological:     Mental Status: He is alert and oriented to person, place, and time. Mental status is at baseline.  Psychiatric:        Mood and Affect: Mood normal.        Behavior: Behavior normal.       Last depression screening scores PHQ 2/9 Scores 05/12/2020 04/09/2019 04/17/2018  PHQ - 2 Score 0 0 0  PHQ- 9 Score 0 0 1   Last fall risk screening Fall Risk  05/12/2020  Falls in the past year? 0  Number falls in past yr: 0  Injury with Fall? 0  Follow up Falls evaluation completed   Last Audit-C alcohol use screening Alcohol Use Disorder Test (AUDIT) 05/12/2020  1. How often do you have a drink containing alcohol? 3  2. How many drinks containing alcohol do you have on a typical day when you are drinking? 0  3. How often do you have six or more drinks on one occasion? 1  AUDIT-C Score 4  4. How often during the last year have you found that you were not able to stop drinking once you had started? 0  5. How often during the last year have you failed to do what was normally expected from you  because of drinking? 0  6. How often during the last year have you needed a first drink in the morning to get yourself going after a heavy drinking session? 0  7. How often during the last year have you had a feeling of guilt of remorse after drinking? 0  8. How often during the last year have you been unable to remember what happened the night before because you had been drinking? 0  9. Have you or someone else been injured as a result of your drinking? 0  10. Has a  relative or friend or a doctor or another health worker been concerned about your drinking or suggested you cut down? 0  Alcohol Use Disorder Identification Test Final Score (AUDIT) 4   A score of 3 or more in women, and 4 or more in men indicates increased risk for alcohol abuse, EXCEPT if all of the points are from question 1   No results found for any visits on 07/02/20.  Assessment & Plan    Routine Health Maintenance and Physical Exam  Exercise Activities and Dietary recommendations Goals   None     Immunization History  Administered Date(s) Administered  . Hepatitis A, Adult 08/25/2010  . Hepatitis B, adult 06/07/2018, 07/10/2018, 11/06/2018  . Influenza,inj,Quad PF,6+ Mos 04/27/2017, 04/17/2018, 03/07/2019, 05/12/2020  . Measles 02/18/1988  . Rubella 02/18/1988  . Td 04/27/2017  . Tdap 02/21/2007    Health Maintenance  Topic Date Due  . COVID-19 Vaccine (1) Never done  . COLONOSCOPY (Pts 45-16yrs Insurance coverage will need to be confirmed)  07/04/2023  . TETANUS/TDAP  04/28/2027  . INFLUENZA VACCINE  Completed  . Hepatitis C Screening  Completed  . HIV Screening  Completed    Discussed health benefits of physical activity, and encouraged him to engage in regular exercise appropriate for his age and condition.  1. Annual physical exam  - TSH - Lipid panel - Comprehensive metabolic panel - CBC with Differential/Platelet - HgB A1c  2. Hyperlipidemia, unspecified hyperlipidemia type  Continue  crestor 40 mg QHS.    Return in about 1 year (around 07/02/2021) for cpe chronic.     ITrey Sailors, PA-C, have reviewed all documentation for this visit. The documentation on 07/02/20 for the exam, diagnosis, procedures, and orders are all accurate and complete.  The entirety of the information documented in the History of Present Illness, Review of Systems and Physical Exam were personally obtained by me. Portions of this information were initially documented by [facility] and reviewed by me for thoroughness and accuracy.     Maryella Shivers  [facility] [phone] (phone) [phone] (fax)  [facility]

## 2020-07-02 ENCOUNTER — Other Ambulatory Visit: Payer: Self-pay

## 2020-07-02 ENCOUNTER — Encounter: Payer: Self-pay | Admitting: Physician Assistant

## 2020-07-02 ENCOUNTER — Ambulatory Visit (INDEPENDENT_AMBULATORY_CARE_PROVIDER_SITE_OTHER): Payer: No Typology Code available for payment source | Admitting: Physician Assistant

## 2020-07-02 VITALS — BP 129/78 | HR 68 | Temp 98.5°F | Ht 72.0 in | Wt 220.9 lb

## 2020-07-02 DIAGNOSIS — Z0000 Encounter for general adult medical examination without abnormal findings: Secondary | ICD-10-CM | POA: Diagnosis not present

## 2020-07-02 DIAGNOSIS — E785 Hyperlipidemia, unspecified: Secondary | ICD-10-CM

## 2020-07-02 NOTE — Patient Instructions (Signed)

## 2020-07-03 LAB — HEMOGLOBIN A1C
Est. average glucose Bld gHb Est-mCnc: 108 mg/dL
Hgb A1c MFr Bld: 5.4 % (ref 4.8–5.6)

## 2020-07-03 LAB — COMPREHENSIVE METABOLIC PANEL
ALT: 29 IU/L (ref 0–44)
AST: 35 IU/L (ref 0–40)
Albumin/Globulin Ratio: 2.1 (ref 1.2–2.2)
Albumin: 4.8 g/dL (ref 3.8–4.9)
Alkaline Phosphatase: 63 IU/L (ref 44–121)
BUN/Creatinine Ratio: 18 (ref 9–20)
BUN: 22 mg/dL (ref 6–24)
Bilirubin Total: 0.8 mg/dL (ref 0.0–1.2)
CO2: 22 mmol/L (ref 20–29)
Calcium: 9.4 mg/dL (ref 8.7–10.2)
Chloride: 103 mmol/L (ref 96–106)
Creatinine, Ser: 1.24 mg/dL (ref 0.76–1.27)
GFR calc Af Amer: 74 mL/min/{1.73_m2} (ref >59)
GFR calc non Af Amer: 64 mL/min/{1.73_m2} (ref >59)
Globulin, Total: 2.3 g/dL (ref 1.5–4.5)
Glucose: 82 mg/dL (ref 65–99)
Potassium: 4.6 mmol/L (ref 3.5–5.2)
Sodium: 139 mmol/L (ref 134–144)
Total Protein: 7.1 g/dL (ref 6.0–8.5)

## 2020-07-03 LAB — CBC WITH DIFFERENTIAL/PLATELET
Basophils Absolute: 0 10*3/uL (ref 0.0–0.2)
Basos: 0 %
EOS (ABSOLUTE): 0.1 10*3/uL (ref 0.0–0.4)
Eos: 1 %
Hematocrit: 43.6 % (ref 37.5–51.0)
Hemoglobin: 14.8 g/dL (ref 13.0–17.7)
Immature Grans (Abs): 0 10*3/uL (ref 0.0–0.1)
Immature Granulocytes: 1 %
Lymphocytes Absolute: 1.5 10*3/uL (ref 0.7–3.1)
Lymphs: 37 %
MCH: 31.6 pg (ref 26.6–33.0)
MCHC: 33.9 g/dL (ref 31.5–35.7)
MCV: 93 fL (ref 79–97)
Monocytes Absolute: 0.4 10*3/uL (ref 0.1–0.9)
Monocytes: 10 %
Neutrophils Absolute: 2.1 10*3/uL (ref 1.4–7.0)
Neutrophils: 51 %
Platelets: 147 10*3/uL — ABNORMAL LOW (ref 150–450)
RBC: 4.69 x10E6/uL (ref 4.14–5.80)
RDW: 12.2 % (ref 11.6–15.4)
WBC: 4.1 10*3/uL (ref 3.4–10.8)

## 2020-07-03 LAB — LDL CHOLESTEROL, DIRECT: LDL Direct: 139 mg/dL — ABNORMAL HIGH (ref 0–99)

## 2020-07-03 LAB — LIPID PANEL
Chol/HDL Ratio: 3.3 ratio (ref 0.0–5.0)
Cholesterol, Total: 215 mg/dL — ABNORMAL HIGH (ref 100–199)
HDL: 66 mg/dL (ref >39)
LDL Chol Calc (NIH): 138 mg/dL — ABNORMAL HIGH (ref 0–99)
Triglycerides: 61 mg/dL (ref 0–149)
VLDL Cholesterol Cal: 11 mg/dL (ref 5–40)

## 2020-07-03 LAB — TSH: TSH: 1.67 u[IU]/mL (ref 0.450–4.500)

## 2020-07-08 ENCOUNTER — Encounter: Payer: Self-pay | Admitting: Physician Assistant

## 2021-04-12 ENCOUNTER — Telehealth: Payer: Self-pay | Admitting: Family Medicine

## 2021-04-12 DIAGNOSIS — E785 Hyperlipidemia, unspecified: Secondary | ICD-10-CM

## 2021-04-12 MED ORDER — ROSUVASTATIN CALCIUM 40 MG PO TABS: 40.0000 mg | ORAL_TABLET | Freq: Every day | ORAL | 1 refills | Status: DC

## 2021-04-12 NOTE — Telephone Encounter (Signed)
[facility] faxed refill request for the following medications:  rosuvastatin (CRESTOR) 40 MG tablet  Last Rx: 06/29/20 LOV: 07/02/20 with Adriana No upcoming appt scheduled at this time. Please advise. Thanks TNP

## 2021-05-04 ENCOUNTER — Ambulatory Visit: Payer: No Typology Code available for payment source | Admitting: Family Medicine

## 2021-05-04 ENCOUNTER — Other Ambulatory Visit: Payer: Self-pay

## 2021-05-04 ENCOUNTER — Encounter: Payer: Self-pay | Admitting: Family Medicine

## 2021-05-04 VITALS — BP 135/89 | HR 66 | Temp 97.6°F | Resp 15 | Wt 228.3 lb

## 2021-05-04 DIAGNOSIS — E785 Hyperlipidemia, unspecified: Secondary | ICD-10-CM

## 2021-05-04 DIAGNOSIS — R0781 Pleurodynia: Secondary | ICD-10-CM

## 2021-05-04 DIAGNOSIS — Z23 Encounter for immunization: Secondary | ICD-10-CM

## 2021-05-04 DIAGNOSIS — W19XXXS Unspecified fall, sequela: Secondary | ICD-10-CM | POA: Diagnosis not present

## 2021-05-04 DIAGNOSIS — W19XXXA Unspecified fall, initial encounter: Secondary | ICD-10-CM | POA: Insufficient documentation

## 2021-05-04 MED ORDER — ROSUVASTATIN CALCIUM 40 MG PO TABS
40.0000 mg | ORAL_TABLET | Freq: Every day | ORAL | 3 refills | Status: DC
Start: 2021-05-04 — End: 2022-07-11

## 2021-05-04 MED ORDER — MELOXICAM 15 MG PO TABS
15.0000 mg | ORAL_TABLET | Freq: Every day | ORAL | 0 refills | Status: DC
Start: 2021-05-04 — End: 2021-11-03

## 2021-05-04 MED ORDER — BACLOFEN 20 MG PO TABS: 20.0000 mg | ORAL_TABLET | Freq: Three times a day (TID) | ORAL | 0 refills | Status: DC

## 2021-05-04 NOTE — Progress Notes (Signed)
Established patient visit   Patient: Jose Khan   DOB: [birth year]   60 y.o. Male  MRN: [id number] Visit Date: 05/04/2021  Today's healthcare provider: Gwyneth Sprout, FNP   Re-establish care with new PCP, to get statin medication and follow-up from fall with rib contusion.  Subjective    HPI  Lipid/Cholesterol, Follow-up  Last lipid panel Other pertinent labs  Lab Results  Component Value Date   CHOL 215 (H) 07/02/2020   HDL 66 07/02/2020   LDLCALC 138 (H) 07/02/2020   LDLDIRECT 139 (H) 07/02/2020   TRIG 61 07/02/2020   CHOLHDL 3.3 07/02/2020   Lab Results  Component Value Date   ALT 29 07/02/2020   AST 35 07/02/2020   PLT 147 (L) 07/02/2020   TSH 1.670 07/02/2020     He was last seen for this 10 months ago.  Management since that visit includes none.  He reports excellent compliance with treatment. He is not having side effects.   Symptoms: No chest pain No chest pressure/discomfort  No dyspnea No lower extremity edema  No numbness or tingling of extremity No orthopnea  No palpitations No paroxysmal nocturnal dyspnea  No speech difficulty No syncope   Current diet: well balanced Current exercise: cardiovascular workout on exercise equipment, walking, and karate   The 10-year ASCVD risk score (Arnett DK, et al., 2019) is: 6.9%  ---------------------------------------------------------------------------------------------------   Medications: Outpatient Medications Prior to Visit  Medication Sig   [DISCONTINUED] rosuvastatin (CRESTOR) 40 MG tablet Take 1 tablet (40 mg total) by mouth daily.   No facility-administered medications prior to visit.    Review of Systems     Objective    BP 135/89   Pulse 66   Temp 97.6 F (36.4 C) (Oral)   Resp 15   Wt 228 lb 4.8 oz (103.6 kg)   BMI 30.96 kg/m    Physical Exam Vitals and nursing note reviewed.  Constitutional:      Appearance: Normal appearance. He is obese.  HENT:     Head:  Normocephalic and atraumatic.  Eyes:     Pupils: Pupils are equal, round, and reactive to light.  Cardiovascular:     Rate and Rhythm: Normal rate and regular rhythm.     Pulses: Normal pulses.     Heart sounds: Normal heart sounds.  Pulmonary:     Effort: Pulmonary effort is normal.     Breath sounds: Normal breath sounds.  Musculoskeletal:        General: Tenderness and signs of injury present. Normal range of motion.     Cervical back: Normal range of motion.     Comments: Fall a few weeks back off porch onto R side  Skin:    General: Skin is warm and dry.     Capillary Refill: Capillary refill takes less than 2 seconds.  Neurological:     General: No focal deficit present.     Mental Status: He is alert and oriented to person, place, and time. Mental status is at baseline.  Psychiatric:        Mood and Affect: Mood normal.        Behavior: Behavior normal.        Thought Content: Thought content normal.        Judgment: Judgment normal.     No results found for any visits on 05/04/21.  Assessment & Plan     Problem List Items Addressed This Visit  Other   Rib pain on right side    Acute pain at site of fall; no imaging done s/p fall Continue NSAIDS and trial of muscle relaxant to assist      Hyperlipidemia - Primary    ASCVD risk reviewed Denied current concerns Discussed diet and exercise Refills of medication provided      Relevant Medications   rosuvastatin (CRESTOR) 40 MG tablet   Fall    Denies further falls Denies LOC or syncope Doing well overall; some pain with sleeping -trial of mobic and baclofen to assist with muscle tightening       Flu vaccine need    provided      Relevant Orders   Flu Vaccine QUAD 86mo+IM (Fluarix, Fluzone & Alfiuria Quad PF)     Return in about 3 months (around 08/04/2021) for annual examination.     Vonna Kotyk, FNP, have reviewed all documentation for this visit. The documentation on 05/04/21 for the  exam, diagnosis, procedures, and orders are all accurate and complete.    Gwyneth Sprout, [place] [phone] (phone) [phone] (fax)  [place]

## 2021-05-04 NOTE — Patient Instructions (Signed)
Current risk of stroke and heart attack is The 10-year ASCVD risk score (Arnett DK, et al., 2019) is: 6.9%   Values used to calculate the score:     Age: 60 years     Sex: Male     Is Non-Hispanic African American: No     Diabetic: No     Tobacco smoker: No     Systolic Blood Pressure: 011 mmHg     Is BP treated: No     HDL Cholesterol: 66 mg/dL     Total Cholesterol: 215 mg/dL  We recommend diet low in saturated fat and regular exercise - 30 min at least 5 times per week

## 2021-05-04 NOTE — Assessment & Plan Note (Signed)
Acute pain at site of fall; no imaging done s/p fall Continue NSAIDS and trial of muscle relaxant to assist

## 2021-05-04 NOTE — Assessment & Plan Note (Signed)
provided ?

## 2021-05-04 NOTE — Assessment & Plan Note (Signed)
Denies further falls Denies LOC or syncope Doing well overall; some pain with sleeping -trial of mobic and baclofen to assist with muscle tightening

## 2021-05-04 NOTE — Assessment & Plan Note (Signed)
ASCVD risk reviewed Denied current concerns Discussed diet and exercise Refills of medication provided

## 2021-07-02 ENCOUNTER — Encounter: Payer: Self-pay | Admitting: Physician Assistant

## 2021-10-25 ENCOUNTER — Encounter: Payer: Self-pay | Admitting: Family Medicine

## 2021-11-02 NOTE — Progress Notes (Signed)
?  ? ? ?I,Roshena L Chambers,acting as a scribe for Gwyneth Sprout, FNP.,have documented all relevant documentation on the behalf of Gwyneth Sprout, FNP,as directed by  Gwyneth Sprout, FNP while in the presence of Gwyneth Sprout, FNP.  ? ?Established patient visit ? ? ?Patient: Jose Khan   DOB: May 09, 1961   61 y.o. Male  MRN: [id number] ?Visit Date: 11/03/2021 ? ?Today's healthcare provider: Gwyneth Sprout, FNP  ?Re Introduced to nurse practitioner role and practice setting.  All questions answered.  Discussed provider/patient relationship and expectations. ? ? ?Chief Complaint  ?Patient presents with  ? Back Pain  ? ?Subjective  ?  ?Back Pain ?This is a chronic problem. Pertinent negatives include no abdominal pain, chest pain or fever.   ?Patient reports having low back pain 4 weeks ago after running. Pain is now resolved.  ? ?Medications: ?Outpatient Medications Prior to Visit  ?Medication Sig  ? rosuvastatin (CRESTOR) 40 MG tablet Take 1 tablet (40 mg total) by mouth daily.  ? [DISCONTINUED] baclofen (LIORESAL) 20 MG tablet Take 1 tablet (20 mg total) by mouth 3 (three) times daily. (Patient not taking: Reported on 11/03/2021)  ? [DISCONTINUED] meloxicam (MOBIC) 15 MG tablet Take 1 tablet (15 mg total) by mouth daily. (Patient not taking: Reported on 11/03/2021)  ? ?No facility-administered medications prior to visit.  ? ? ?Review of Systems  ?Constitutional:  Negative for appetite change, chills and fever.  ?Respiratory:  Negative for chest tightness, shortness of breath and wheezing.   ?Cardiovascular:  Negative for chest pain and palpitations.  ?Gastrointestinal:  Negative for abdominal pain, nausea and vomiting.  ?Musculoskeletal:  Positive for back pain.  ? ? ?  Objective  ?  ?BP (!) 126/92 (BP Location: Right Arm, Patient Position: Sitting, Cuff Size: Large)   Pulse (!) 55   Temp 97.7 ?F (36.5 ?C) (Oral)   Resp 16   Wt 226 lb (102.5 kg)   SpO2 99% Comment: room air  BMI 30.65 kg/m?  ? ? ?Physical Exam   ?Back Exam: ?   Inspection:  Normal spinal curvature.  No deformity, ecchymosis, erythema, or lesions  ? Curvature: Normal  ? Deformity: no ?  ?   Palpation: ?    Midline spinal tenderness: no none    Paralumbar tenderness: no bilateral ?    Parathoracic tenderness: no bilateral ?    Buttocks tenderness: nobilateral ?    Range of Motion: now WDL ?   ?   Neuro Exam:Lower extremity DTRs normal & symmetric.  Strength and sensation intact. ?    Patellar DTRs: Normal and Symmetric ?   ?   Special Tests:  ?    Straight leg raise:negative ?    ? ?No results found for any visits on 11/03/21. ? Assessment & Plan  ?  ? ?Problem List Items Addressed This Visit   ? ?  ? Musculoskeletal and Integument  ? Bulging lumbar disc  ?  Acute on chronic, >20 years; last MRI completed in 1999 ?Used to exercise and symptoms would subside; however, tried to exercise and then symptoms worsened ?Denies loss of stool or urine ?Denies paresthesia ?Negative SLR ?Recently exacerbated x2 weeks, denies known mechanism of injury ?Tried mobic and baclofen previously prescribed last fall s/p fall for 2-3 days and noted slight improvement ?Also saw chiropractor with use of TENS unit ?Has been more active, building a structure outdoors with a friend ? ? ?  ?  ? Relevant Orders  ?  MR Lumbar Spine W Wo Contrast  ? Ambulatory referral to Orthopedic Surgery  ?  ? Other  ? Chronic bilateral thoracic back pain - Primary  ?  Acute on chronic, >20 years ?Recently exacerbated x2 weeks, denies known mechanism of injury ?Tried mobic and baclofen previously prescribed last fall s/p fall for 2-3 days and noted slight improvement ?Also saw chiropractor with use of TENS unit ?Has been more active, building a structure outdoors with a friend ? ?  ?  ? Relevant Orders  ? MR THORACIC SPINE W WO CONTRAST  ? Ambulatory referral to Orthopedic Surgery  ? ? ? ?Return if symptoms worsen or fail to improve.  ?   ? ?I, Gwyneth Sprout, FNP, have reviewed all documentation for  this visit. The documentation on 11/03/21 for the exam, diagnosis, procedures, and orders are all accurate and complete. ? ? ? ?Gwyneth Sprout, FNP  ?[place] ?[phone] (phone) ?[phone] (fax) ? ?[facility]  ?

## 2021-11-03 ENCOUNTER — Other Ambulatory Visit: Payer: Self-pay | Admitting: Family Medicine

## 2021-11-03 ENCOUNTER — Encounter: Payer: Self-pay | Admitting: Family Medicine

## 2021-11-03 ENCOUNTER — Ambulatory Visit: Payer: No Typology Code available for payment source | Admitting: Family Medicine

## 2021-11-03 VITALS — BP 126/92 | HR 55 | Temp 97.7°F | Resp 16 | Wt 226.0 lb

## 2021-11-03 DIAGNOSIS — M5134 Other intervertebral disc degeneration, thoracic region: Secondary | ICD-10-CM

## 2021-11-03 DIAGNOSIS — M5136 Other intervertebral disc degeneration, lumbar region: Secondary | ICD-10-CM | POA: Diagnosis not present

## 2021-11-03 DIAGNOSIS — G8929 Other chronic pain: Secondary | ICD-10-CM | POA: Diagnosis not present

## 2021-11-03 DIAGNOSIS — M546 Pain in thoracic spine: Secondary | ICD-10-CM

## 2021-11-03 DIAGNOSIS — M51369 Other intervertebral disc degeneration, lumbar region without mention of lumbar back pain or lower extremity pain: Secondary | ICD-10-CM

## 2021-11-03 NOTE — Assessment & Plan Note (Signed)
Acute on chronic, >20 years; last MRI completed in 1999 ?Used to exercise and symptoms would subside; however, tried to exercise and then symptoms worsened ?Denies loss of stool or urine ?Denies paresthesia ?Negative SLR ?Recently exacerbated x2 weeks, denies known mechanism of injury ?Tried mobic and baclofen previously prescribed last fall s/p fall for 2-3 days and noted slight improvement ?Also saw chiropractor with use of TENS unit ?Has been more active, building a structure outdoors with a friend ? ?

## 2021-11-03 NOTE — Assessment & Plan Note (Signed)
Acute on chronic, >20 years ?Recently exacerbated x2 weeks, denies known mechanism of injury ?Tried mobic and baclofen previously prescribed last fall s/p fall for 2-3 days and noted slight improvement ?Also saw chiropractor with use of TENS unit ?Has been more active, building a structure outdoors with a friend ?

## 2021-11-08 ENCOUNTER — Telehealth: Payer: Self-pay | Admitting: Family Medicine

## 2021-11-08 NOTE — Telephone Encounter (Signed)
Pt calling to say he got a letter from Jose Khan stating his lumbar was approved, but the [facility] has not been approved. ? ?He wanted to know if he should cancel his appts tomorrow. He wants to do everything at same time. ?I have sent message to Judson Roch. ?

## 2021-11-09 ENCOUNTER — Telehealth: Payer: Self-pay

## 2021-11-09 NOTE — Telephone Encounter (Signed)
I have left a message for pt to return call. The MRI that has been approved is for [facility]. I do not see any upcoming appointments scheduled there. ?

## 2021-11-09 NOTE — Telephone Encounter (Signed)
Copied from [place] [id number]. Topic: General - Other ?>> Nov 09, 2021  8:57 AM Tessa Lerner A wrote: ?Reason for CRM: The patient has requested to speak directly with a referral coordinator regarding their MRI ? ?The patient has stressed the urgency of their request  ? ?Please contact further when possible ?

## 2021-11-23 ENCOUNTER — Ambulatory Visit: Payer: BLUE CROSS/BLUE SHIELD

## 2022-05-30 ENCOUNTER — Encounter: Payer: Self-pay | Admitting: Family Medicine

## 2022-05-30 ENCOUNTER — Ambulatory Visit: Payer: No Typology Code available for payment source | Admitting: Family Medicine

## 2022-05-30 VITALS — BP 115/85 | HR 65 | Temp 98.2°F

## 2022-05-30 DIAGNOSIS — R058 Other specified cough: Secondary | ICD-10-CM | POA: Diagnosis not present

## 2022-05-30 NOTE — Progress Notes (Signed)
     I,Jose Khan,acting as a Education administrator for Jose Gip, DO.,have documented all relevant documentation on the behalf of Jose Gip, DO,as directed by  Jose Gip, DO while in the presence of Jose Gip, DO.   Established patient visit   Patient: Jose Khan   DOB: October 10, 1960   61 y.o. Male  MRN: [id number] Visit Date: 05/30/2022  Today's healthcare provider: [name], DO   Chief Complaint  Patient presents with   Cough   Nasal Congestion    >3 wks   Subjective    HPI HPI     Nasal Congestion    Additional comments: >3 wks      Last edited by Sula Rumple on 05/30/2022  2:01 PM.       UPPER RESPIRATORY TRACT INFECTION - symptom onset 3 weeks ago - most sx resolved, still with some lingering cough.   Worst symptom: Fever: no Cough: yes, mucus productive. Shortness of breath: no Chest pain: no Chest tightness:  with coughing Chest congestion: yes Nasal congestion: yes, improved Runny nose: yes, improved Sore throat: no Sinus pressure: no Vomiting: no Sick contacts:  7 grandkids Context: better Recurrent sinusitis: no Relief with OTC cold/cough medications: no  Treatments attempted: nyquil, dayquil, allergy medicine   Medications: Outpatient Medications Prior to Visit  Medication Sig   rosuvastatin (CRESTOR) 40 MG tablet Take 1 tablet (40 mg total) by mouth daily.   No facility-administered medications prior to visit.    Review of Systems     Objective    BP 115/85 (BP Location: Right Arm)   Pulse 65   Temp 98.2 F (36.8 C)   SpO2 96%    Physical Exam  Gen: well appearing, in NAD HEENT: orophyarynx clear without exudate or erythema. Uvula midline. No tonsillar enlargement. Good dentition. TM visible b/l without bulging, erythema, purulence. NonTTP over maxillary or frontal sinuses. Card: RRR Lungs: CTAB Ext: WWP, no edema   No results found for any visits on 05/30/22.  Assessment & Plan      Post-viral cough URI 3 weeks ago, most symptoms resolving except for nonproductive cough, likely postviral. Lung, HEENT exam normal today, unlikely superimposed PNA, new onset CHF, PE. Recommend daily antihistamine for the next few weeks. RTC for new or worsening symptoms.   Return if symptoms worsen or fail to improve.        Jose Khan, [place] [phone] (phone) [phone] (fax)  [place]

## 2022-05-31 ENCOUNTER — Ambulatory Visit: Payer: No Typology Code available for payment source | Admitting: Family Medicine

## 2022-06-15 ENCOUNTER — Ambulatory Visit
Admission: RE | Admit: 2022-06-15 | Discharge: 2022-06-15 | Disposition: A | Discharge status: Home / Self Care | Payer: No Typology Code available for payment source | Source: Ambulatory Visit | Attending: Family Medicine | Admitting: Family Medicine

## 2022-06-15 ENCOUNTER — Ambulatory Visit
Admission: RE | Admit: 2022-06-15 | Discharge: 2022-06-15 | Disposition: A | Discharge status: Home / Self Care | Payer: No Typology Code available for payment source | Attending: Family Medicine | Admitting: Family Medicine

## 2022-06-15 ENCOUNTER — Ambulatory Visit: Payer: No Typology Code available for payment source | Admitting: Family Medicine

## 2022-06-15 ENCOUNTER — Encounter: Payer: Self-pay | Admitting: Family Medicine

## 2022-06-15 VITALS — BP 137/95 | HR 64 | Resp 16 | Wt 232.0 lb

## 2022-06-15 DIAGNOSIS — R052 Subacute cough: Secondary | ICD-10-CM

## 2022-06-15 MED ORDER — PREDNISONE 10 MG (21) PO TBPK: ORAL_TABLET | ORAL | 0 refills | Status: DC

## 2022-06-15 NOTE — Progress Notes (Signed)
   SUBJECTIVE:   CHIEF COMPLAINT / HPI:   COUGH Duration: months Circumstances of initial development of cough: URI Cough description: productive of mucus Aggravating factors:  worse at night Alleviating factors: nothing Status:  stable Treatments attempted: mucinex and cough syrup, antihistamine Wheezing:  expiratory, worse at night Shortness of breath: no Chest pain: no Chest tightness:no Nasal congestion: no Hemoptysis: no Fevers: no    OBJECTIVE:   BP (!) 137/95 (BP Location: Right Arm, Patient Position: Sitting, Cuff Size: Large)   Pulse 64   Resp 16   Wt 232 lb (105.2 kg)   SpO2 99%   BMI 31.46 kg/m   Gen: well appearing, in NAD Card: RRR Lungs: CTAB. No wheeze, rales, rhonchi Ext: WWP, no edema   ASSESSMENT/PLAN:   Cough Subacute, initially triggered by URI, ~2 months duration and with no relief with antihistamine, mucinex. No findings to concern for PNA or new onset CHF. May still be post-viral syndrome but with prolonged duration, will trial steroid taper and obtain CXR, continue antihistamine.  Can consider trialling PPI if remains persistent. F/u for new, persistent, worsening symptoms.    [name], DO

## 2022-06-17 ENCOUNTER — Encounter: Payer: Self-pay | Admitting: Family Medicine

## 2022-07-03 ENCOUNTER — Encounter: Payer: Self-pay | Admitting: Family Medicine

## 2022-07-04 ENCOUNTER — Ambulatory Visit: Payer: No Typology Code available for payment source | Admitting: Family Medicine

## 2022-07-11 ENCOUNTER — Other Ambulatory Visit: Payer: Self-pay | Admitting: Family Medicine

## 2022-07-11 DIAGNOSIS — E785 Hyperlipidemia, unspecified: Secondary | ICD-10-CM

## 2022-07-13 ENCOUNTER — Other Ambulatory Visit: Payer: Self-pay | Admitting: Family Medicine

## 2022-07-13 DIAGNOSIS — E785 Hyperlipidemia, unspecified: Secondary | ICD-10-CM

## 2022-10-10 ENCOUNTER — Other Ambulatory Visit: Payer: Self-pay | Admitting: Family Medicine

## 2022-10-10 DIAGNOSIS — E785 Hyperlipidemia, unspecified: Secondary | ICD-10-CM

## 2022-10-11 NOTE — Progress Notes (Unsigned)
I,J'ya E Doyle Kunath,acting as a scribe for Jacky Kindle, FNP.,have documented all relevant documentation on the behalf of Jacky Kindle, FNP,as directed by  Jacky Kindle, FNP while in the presence of Jacky Kindle, FNP.  Complete physical exam  Patient: Jose Khan   DOB: 09-23-1960   62 y.o. Male  MRN: [id number] Visit Date: 10/12/2022  Today's healthcare provider: Jacky Kindle, FNP  Re Introduced to nurse practitioner role and practice setting.  All questions answered.  Discussed provider/patient relationship and expectations.  Chief Complaint  Patient presents with   Annual Exam   Subjective    Jose Khan is a 62 y.o. male who presents today for a complete physical exam.  He reports consuming a general diet. Home exercise routine includes walking 3-4 miles daily and running 5-10 miles every other day.    He generally feels well. He reports sleeping fairly well. Patient gets 5-6 hours a night. He does not have additional problems to discuss today.   HPI   No past medical history on file. Past Surgical History:  Procedure Laterality Date   COLONOSCOPY WITH PROPOFOL N/A 07/03/2018   Procedure: COLONOSCOPY WITH PROPOFOL;  Surgeon: Midge Minium, MD;  Location[facility] ENDOSCOPY;  Service: Endoscopy;  Laterality: N/A;   ESOPHAGOGASTRODUODENOSCOPY (EGD) WITH PROPOFOL N/A 07/03/2018   Procedure: ESOPHAGOGASTRODUODENOSCOPY (EGD) WITH PROPOFOL;  Surgeon: Midge Minium, MD;  Location: [facility] ENDOSCOPY;  Service: Endoscopy;  Laterality: N/A;   KNEE ARTHROSCOPY Right    Social History   Socioeconomic History   Marital status: Married    Spouse name: Not on file   Number of children: Not on file   Years of education: Not on file   Highest education level: Associate degree: occupational, Scientist, product/process development, or vocational program  Occupational History   Not on file  Tobacco Use   Smoking status: Never   Smokeless tobacco: Never  Vaping Use   Vaping Use: Never used  Substance and Sexual  Activity   Alcohol use: Yes    Alcohol/week: 4.0 standard drinks of alcohol    Types: 1 Glasses of wine, 1 Cans of beer, 2 Shots of liquor per week   Drug use: No   Sexual activity: Not on file  Other Topics Concern   Not on file  Social History Narrative   Not on file   Social Determinants of Health   Financial Resource Strain: Low Risk  (10/11/2022)   Overall Financial Resource Strain (CARDIA)    Difficulty of Paying Living Expenses: Not hard at all  Food Insecurity: No Food Insecurity (10/11/2022)   Hunger Vital Sign    Worried About Running Out of Food in the Last Year: Never true    Ran Out of Food in the Last Year: Never true  Transportation Needs: No Transportation Needs (10/11/2022)   PRAPARE - Administrator, Civil Service (Medical): No    Lack of Transportation (Non-Medical): No  Physical Activity: Sufficiently Active (10/11/2022)   Exercise Vital Sign    Days of Exercise per Week: 7 days    Minutes of Exercise per Session: 90 min  Stress: No Stress Concern Present (10/11/2022)   [employer] of Occupational Health - Occupational Stress Questionnaire    Feeling of Stress : Not at all  Social Connections: Socially Integrated (10/11/2022)   Social Connection and Isolation Panel [NHANES]    Frequency of Communication with Friends and Family: More than three times a week    Frequency of Social  Gatherings with Friends and Family: More than three times a week    Attends Religious Services: More than 4 times per year    Active Member of [employer] or Organizations: Yes    Attends Banker Meetings: 1 to 4 times per year    Marital Status: Married  Catering manager Violence: Not on file   Family Status  Relation Name Status   Mother  Alive   Father  Alive   MGM  (Not Specified)   Family History  Problem Relation Age of Onset   Cancer Mother    Parkinson's disease Mother    Macular degeneration Mother    Breast cancer Mother    Alcohol abuse  Father    Heart disease Father    Dementia Father    CAD Father    Dementia Maternal Grandmother    No Known Allergies  Patient Care Team: Jacky Kindle, FNP as PCP - General (Family Medicine)   Medications: Outpatient Medications Prior to Visit  Medication Sig   [DISCONTINUED] rosuvastatin (CRESTOR) 40 MG tablet Take 1 tablet (40 mg total) by mouth daily. Please schedule office visit before any future refill.   [DISCONTINUED] predniSONE (STERAPRED UNI-PAK 21 TAB) 10 MG (21) TBPK tablet Take per package directions. (Patient not taking: Reported on 10/12/2022)   No facility-administered medications prior to visit.    Review of Systems  HENT:  Positive for nosebleeds.     Objective    BP 115/86 (BP Location: Left Arm, Patient Position: Sitting, Cuff Size: Large)   Pulse (!) 56   Temp 98.4 F (36.9 C) (Oral)   Resp 16   Ht 6' (1.829 m)   Wt 216 lb 9.6 oz (98.2 kg)   SpO2 100%   BMI 29.38 kg/m   Physical Exam Vitals and nursing note reviewed.  Constitutional:      General: He is awake. He is not in acute distress.    Appearance: Normal appearance. He is well-developed, well-groomed and overweight. He is not ill-appearing, toxic-appearing or diaphoretic.  HENT:     Head: Normocephalic and atraumatic.     Jaw: There is normal jaw occlusion. No trismus, tenderness, swelling or pain on movement.     Salivary Glands: Right salivary gland is not diffusely enlarged or tender. Left salivary gland is not diffusely enlarged or tender.     Right Ear: Hearing, tympanic membrane, ear canal and external ear normal. There is no impacted cerumen.     Left Ear: Hearing, tympanic membrane, ear canal and external ear normal. There is no impacted cerumen.     Nose: Nose normal. No congestion or rhinorrhea.     Right Turbinates: Not enlarged, swollen or pale.     Left Turbinates: Not enlarged, swollen or pale.     Right Sinus: No maxillary sinus tenderness or frontal sinus tenderness.      Left Sinus: No maxillary sinus tenderness or frontal sinus tenderness.     Mouth/Throat:     Lips: Pink.     Mouth: Mucous membranes are moist. No injury, lacerations, oral lesions or angioedema.     Pharynx: Oropharynx is clear. Uvula midline. No pharyngeal swelling, oropharyngeal exudate or posterior oropharyngeal erythema.     Tonsils: No tonsillar exudate or tonsillar abscesses.  Eyes:     General: Lids are normal. Vision grossly intact. Gaze aligned appropriately.        Right eye: No discharge.        Left eye: No discharge.  Extraocular Movements: Extraocular movements intact.     Conjunctiva/sclera: Conjunctivae normal.     Pupils: Pupils are equal, round, and reactive to light.  Neck:     Thyroid: No thyroid mass, thyromegaly or thyroid tenderness.     Vascular: No carotid bruit.     Trachea: Trachea normal. No tracheal tenderness.  Cardiovascular:     Rate and Rhythm: Regular rhythm. Bradycardia present.     Pulses: Normal pulses.          Carotid pulses are 2+ on the right side and 2+ on the left side.      Radial pulses are 2+ on the right side and 2+ on the left side.       Femoral pulses are 2+ on the right side and 2+ on the left side.      Popliteal pulses are 2+ on the right side and 2+ on the left side.       Dorsalis pedis pulses are 2+ on the right side and 2+ on the left side.       Posterior tibial pulses are 2+ on the right side and 2+ on the left side.     Heart sounds: Normal heart sounds, S1 normal and S2 normal. No murmur heard.    No friction rub. No gallop.  Pulmonary:     Effort: Pulmonary effort is normal. No respiratory distress.     Breath sounds: Normal breath sounds and air entry. No stridor. No wheezing, rhonchi or rales.  Chest:     Chest wall: No tenderness.  Abdominal:     General: Abdomen is flat. Bowel sounds are normal. There is no distension.     Palpations: Abdomen is soft. There is no mass.     Tenderness: There is no abdominal  tenderness. There is no guarding or rebound.     Hernia: No hernia is present.  Genitourinary:    Comments: Exam deferred; denies complaints Musculoskeletal:        General: No swelling, tenderness, deformity or signs of injury. Normal range of motion.     Cervical back: Normal range of motion and neck supple. No rigidity or tenderness.     Right lower leg: No edema.     Left lower leg: No edema.  Lymphadenopathy:     Cervical: No cervical adenopathy.     Right cervical: No superficial, deep or posterior cervical adenopathy.    Left cervical: No superficial, deep or posterior cervical adenopathy.  Skin:    General: Skin is warm and dry.     Capillary Refill: Capillary refill takes less than 2 seconds.     Coloration: Skin is not jaundiced or pale.     Findings: No bruising, erythema, lesion or rash.  Neurological:     General: No focal deficit present.     Mental Status: He is alert and oriented to person, place, and time. Mental status is at baseline.     GCS: GCS eye subscore is 4. GCS verbal subscore is 5. GCS motor subscore is 6.     Sensory: Sensation is intact. No sensory deficit.     Motor: Motor function is intact. No weakness.     Coordination: Coordination is intact.     Gait: Gait is intact.  Psychiatric:        Attention and Perception: Attention and perception normal.        Mood and Affect: Mood and affect normal.        Speech: Speech  normal.        Behavior: Behavior normal. Behavior is cooperative.        Thought Content: Thought content normal.        Cognition and Memory: Cognition normal.        Judgment: Judgment normal.     Last depression screening scores    10/12/2022    3:55 PM 05/04/2021    9:45 AM 05/12/2020    8:16 AM  PHQ 2/9 Scores  PHQ - 2 Score 0 0 0  PHQ- 9 Score 0 1 0   Last fall risk screening    10/12/2022    3:55 PM  Fall Risk   Falls in the past year? 0  Number falls in past yr: 0  Injury with Fall? 0  Risk for fall due to : No  Fall Risks   Last Audit-C alcohol use screening    10/12/2022    3:55 PM  Alcohol Use Disorder Test (AUDIT)  2. How many drinks containing alcohol do you have on a typical day when you are drinking? 0  3. How often do you have six or more drinks on one occasion? 0   A score of 3 or more in women, and 4 or more in men indicates increased risk for alcohol abuse, EXCEPT if all of the points are from question 1   No results found for any visits on 10/12/22.  Assessment & Plan    Routine Health Maintenance and Physical Exam  Exercise Activities and Dietary recommendations  Goals   None     Immunization History  Administered Date(s) Administered   Hepatitis A, Adult 08/25/2010   Hepatitis B, ADULT 06/07/2018, 07/10/2018, 11/06/2018   Influenza,inj,Quad PF,6+ Mos 04/27/2017, 04/17/2018, 03/07/2019, 05/12/2020, 05/04/2021   Janssen (J&J) SARS-COV-2 Vaccination 11/19/2019   Measles 02/18/1988   Rubella 02/18/1988   Td 04/27/2017   Tdap 02/21/2007   Typhoid Live 05/31/2018    Health Maintenance  Topic Date Due   Zoster Vaccines- Shingrix (1 of 2) Never done   COVID-19 Vaccine (2 - 2023-24 season) 02/25/2022   INFLUENZA VACCINE  01/26/2023   COLONOSCOPY (Pts 45-29yrs Insurance coverage will need to be confirmed)  07/04/2023   DTaP/Tdap/Td (3 - Td or Tdap) 04/28/2027   Hepatitis C Screening  Completed   HIV Screening  Completed   HPV VACCINES  Aged Out    Discussed health benefits of physical activity, and encouraged him to engage in regular exercise appropriate for his age and condition.  Problem List Items Addressed This Visit       Other   Annual physical exam - Primary    Denies concerns with vision, dental, hearing No obvious skin ulcerations or concerns Things to do to keep yourself healthy  - Exercise at least 30-45 minutes a day, 3-4 days a week.  - Eat a low-fat diet with lots of fruits and vegetables, up to 7-9 servings per day.  - Seatbelts can save your  life. Wear them always.  - Smoke detectors on every level of your home, check batteries every year.  - Eye Doctor - have an eye exam every 1-2 years  - Safe sex - if you may be exposed to STDs, use a condom.  - Alcohol -  If you drink, do it moderately, less than 2 drinks per day.  - Health Care Power of Attorney. Choose someone to speak for you if you are not able.  - Depression is common in our stressful world.If you're  feeling down or losing interest in things you normally enjoy, please come in for a visit.  - Violence - If anyone is threatening or hurting you, please call immediately.       Relevant Orders   CBC with Differential/Platelet   Comprehensive Metabolic Panel (CMET)   Lipid panel   TSH + free T4   Hyperlipidemia    Chronic, strong familial component Continues on crestor 40 mg       Relevant Medications   rosuvastatin (CRESTOR) 40 MG tablet   Screening for prostate cancer    Denies LUTS; recommend PSA in place of DRE. If PSA is elevated for age, we will repeat; if PSA remains elevated pt will be referred to urology for DRE and next steps for best treatment.       Relevant Orders   PSA   Return in about 1 year (around 10/12/2023) for annual examination.    Leilani Merl, FNP, have reviewed all documentation for this visit. The documentation on 10/12/22 for the exam, diagnosis, procedures, and orders are all accurate and complete.  Jacky Kindle, FNP  [facility] [phone] (phone) [phone] (fax)  [facility]

## 2022-10-12 ENCOUNTER — Ambulatory Visit (INDEPENDENT_AMBULATORY_CARE_PROVIDER_SITE_OTHER): Payer: 59 | Admitting: Family Medicine

## 2022-10-12 VITALS — BP 115/86 | HR 56 | Temp 98.4°F | Resp 16 | Ht 72.0 in | Wt 216.6 lb

## 2022-10-12 DIAGNOSIS — Z125 Encounter for screening for malignant neoplasm of prostate: Secondary | ICD-10-CM

## 2022-10-12 DIAGNOSIS — Z0000 Encounter for general adult medical examination without abnormal findings: Secondary | ICD-10-CM | POA: Insufficient documentation

## 2022-10-12 DIAGNOSIS — E785 Hyperlipidemia, unspecified: Secondary | ICD-10-CM | POA: Diagnosis not present

## 2022-10-12 MED ORDER — ROSUVASTATIN CALCIUM 40 MG PO TABS: 40.0000 mg | ORAL_TABLET | Freq: Every day | ORAL | 3 refills | Status: DC

## 2022-10-12 NOTE — Assessment & Plan Note (Signed)
Denies LUTS; recommend PSA in place of DRE. If PSA is elevated for age, we will repeat; if PSA remains elevated pt will be referred to urology for DRE and next steps for best treatment.  

## 2022-10-12 NOTE — Patient Instructions (Signed)
The CDC recommends two doses of Shingrix (the new shingles vaccine) separated by 2 to 6 months for adults age 60 years and older. I recommend checking with your insurance plan regarding coverage for this vaccine.    

## 2022-10-12 NOTE — Assessment & Plan Note (Signed)
Chronic, strong familial component Continues on crestor 40 mg

## 2022-10-12 NOTE — Assessment & Plan Note (Signed)
Denies concerns with vision, dental, hearing No obvious skin ulcerations or concerns Things to do to keep yourself healthy  - Exercise at least 30-45 minutes a day, 3-4 days a week.  - Eat a low-fat diet with lots of fruits and vegetables, up to 7-9 servings per day.  - Seatbelts can save your life. Wear them always.  - Smoke detectors on every level of your home, check batteries every year.  - Eye Doctor - have an eye exam every 1-2 years  - Safe sex - if you may be exposed to STDs, use a condom.  - Alcohol -  If you drink, do it moderately, less than 2 drinks per day.  - Health Care Power of Attorney. Choose someone to speak for you if you are not able.  - Depression is common in our stressful world.If you're feeling down or losing interest in things you normally enjoy, please come in for a visit.  - Violence - If anyone is threatening or hurting you, please call immediately.

## 2022-10-13 LAB — CBC WITH DIFFERENTIAL/PLATELET
Basophils Absolute: 0 10*3/uL (ref 0.0–0.2)
Basos: 1 %
EOS (ABSOLUTE): 0.1 10*3/uL (ref 0.0–0.4)
Eos: 2 %
Hematocrit: 42.5 % (ref 37.5–51.0)
Hemoglobin: 14.6 g/dL (ref 13.0–17.7)
Immature Grans (Abs): 0 10*3/uL (ref 0.0–0.1)
Immature Granulocytes: 0 %
Lymphocytes Absolute: 1.9 10*3/uL (ref 0.7–3.1)
Lymphs: 42 %
MCH: 31.6 pg (ref 26.6–33.0)
MCHC: 34.4 g/dL (ref 31.5–35.7)
MCV: 92 fL (ref 79–97)
Monocytes Absolute: 0.4 10*3/uL (ref 0.1–0.9)
Monocytes: 8 %
Neutrophils Absolute: 2.2 10*3/uL (ref 1.4–7.0)
Neutrophils: 47 %
Platelets: 138 10*3/uL — ABNORMAL LOW (ref 150–450)
RBC: 4.62 x10E6/uL (ref 4.14–5.80)
RDW: 12 % (ref 11.6–15.4)
WBC: 4.6 10*3/uL (ref 3.4–10.8)

## 2022-10-13 LAB — COMPREHENSIVE METABOLIC PANEL
ALT: 29 IU/L (ref 0–44)
AST: 37 IU/L (ref 0–40)
Albumin/Globulin Ratio: 2.1 (ref 1.2–2.2)
Albumin: 4.7 g/dL (ref 3.8–4.9)
Alkaline Phosphatase: 61 IU/L (ref 44–121)
BUN/Creatinine Ratio: 16 (ref 10–24)
BUN: 17 mg/dL (ref 8–27)
Bilirubin Total: 0.6 mg/dL (ref 0.0–1.2)
CO2: 25 mmol/L (ref 20–29)
Calcium: 10 mg/dL (ref 8.6–10.2)
Chloride: 101 mmol/L (ref 96–106)
Creatinine, Ser: 1.08 mg/dL (ref 0.76–1.27)
Globulin, Total: 2.2 g/dL (ref 1.5–4.5)
Glucose: 76 mg/dL (ref 70–99)
Potassium: 4.7 mmol/L (ref 3.5–5.2)
Sodium: 141 mmol/L (ref 134–144)
Total Protein: 6.9 g/dL (ref 6.0–8.5)
eGFR: 79 mL/min/{1.73_m2} (ref >59)

## 2022-10-13 LAB — LIPID PANEL
Chol/HDL Ratio: 2.9 ratio (ref 0.0–5.0)
Cholesterol, Total: 186 mg/dL (ref 100–199)
HDL: 64 mg/dL (ref >39)
LDL Chol Calc (NIH): 94 mg/dL (ref 0–99)
Triglycerides: 162 mg/dL — ABNORMAL HIGH (ref 0–149)
VLDL Cholesterol Cal: 28 mg/dL (ref 5–40)

## 2022-10-13 LAB — PSA: Prostate Specific Ag, Serum: 0.7 ng/mL (ref 0.0–4.0)

## 2022-10-13 LAB — TSH+FREE T4
Free T4: 0.94 ng/dL (ref 0.82–1.77)
TSH: 2.93 u[IU]/mL (ref 0.450–4.500)

## 2022-10-13 NOTE — Progress Notes (Signed)
LDL has been successfully reduced; moderate risk of heart attack and/or stroke remains at 6% in 10 years per current vitals and labs. Borderline low platelets remains; continue to be mindful of risk for injury or bleeding given delay in clotting time of blood.  The 10-year ASCVD risk score (Arnett DK, et al., 2019) is: 5.6%   Values used to calculate the score:     Age: 62 years     Sex: Male     Is Non-Hispanic African American: No     Diabetic: No     Tobacco smoker: No     Systolic Blood Pressure: 115 mmHg     Is BP treated: No     HDL Cholesterol: 64 mg/dL     Total Cholesterol: 186 mg/dL

## 2022-11-02 ENCOUNTER — Encounter: Payer: Self-pay | Admitting: Family Medicine

## 2023-06-07 DIAGNOSIS — X32XXXA Exposure to sunlight, initial encounter: Secondary | ICD-10-CM | POA: Diagnosis not present

## 2023-06-07 DIAGNOSIS — D2272 Melanocytic nevi of left lower limb, including hip: Secondary | ICD-10-CM | POA: Diagnosis not present

## 2023-06-07 DIAGNOSIS — M71371 Other bursal cyst, right ankle and foot: Secondary | ICD-10-CM | POA: Diagnosis not present

## 2023-06-07 DIAGNOSIS — D225 Melanocytic nevi of trunk: Secondary | ICD-10-CM | POA: Diagnosis not present

## 2023-06-07 DIAGNOSIS — D2271 Melanocytic nevi of right lower limb, including hip: Secondary | ICD-10-CM | POA: Diagnosis not present

## 2023-06-07 DIAGNOSIS — D2262 Melanocytic nevi of left upper limb, including shoulder: Secondary | ICD-10-CM | POA: Diagnosis not present

## 2023-06-07 DIAGNOSIS — L821 Other seborrheic keratosis: Secondary | ICD-10-CM | POA: Diagnosis not present

## 2023-06-07 DIAGNOSIS — D2261 Melanocytic nevi of right upper limb, including shoulder: Secondary | ICD-10-CM | POA: Diagnosis not present

## 2023-06-07 DIAGNOSIS — L570 Actinic keratosis: Secondary | ICD-10-CM | POA: Diagnosis not present

## 2023-09-16 ENCOUNTER — Emergency Department

## 2023-09-16 ENCOUNTER — Other Ambulatory Visit: Payer: Self-pay

## 2023-09-16 ENCOUNTER — Emergency Department
Admission: EM | Admit: 2023-09-16 | Discharge: 2023-09-16 | Disposition: A | Discharge status: Home / Self Care | Attending: Emergency Medicine | Admitting: Emergency Medicine

## 2023-09-16 DIAGNOSIS — R55 Syncope and collapse: Secondary | ICD-10-CM | POA: Diagnosis not present

## 2023-09-16 DIAGNOSIS — S01111A Laceration without foreign body of right eyelid and periocular area, initial encounter: Secondary | ICD-10-CM | POA: Diagnosis not present

## 2023-09-16 DIAGNOSIS — Z23 Encounter for immunization: Secondary | ICD-10-CM | POA: Insufficient documentation

## 2023-09-16 DIAGNOSIS — S0181XA Laceration without foreign body of other part of head, initial encounter: Secondary | ICD-10-CM | POA: Insufficient documentation

## 2023-09-16 DIAGNOSIS — M50323 Other cervical disc degeneration at C6-C7 level: Secondary | ICD-10-CM | POA: Diagnosis not present

## 2023-09-16 DIAGNOSIS — Z79899 Other long term (current) drug therapy: Secondary | ICD-10-CM | POA: Diagnosis not present

## 2023-09-16 DIAGNOSIS — Z743 Need for continuous supervision: Secondary | ICD-10-CM | POA: Diagnosis not present

## 2023-09-16 DIAGNOSIS — M25511 Pain in right shoulder: Secondary | ICD-10-CM | POA: Diagnosis not present

## 2023-09-16 DIAGNOSIS — M419 Scoliosis, unspecified: Secondary | ICD-10-CM | POA: Diagnosis not present

## 2023-09-16 DIAGNOSIS — W228XXA Striking against or struck by other objects, initial encounter: Secondary | ICD-10-CM | POA: Insufficient documentation

## 2023-09-16 DIAGNOSIS — R1084 Generalized abdominal pain: Secondary | ICD-10-CM | POA: Diagnosis not present

## 2023-09-16 DIAGNOSIS — R079 Chest pain, unspecified: Secondary | ICD-10-CM | POA: Diagnosis not present

## 2023-09-16 DIAGNOSIS — R0789 Other chest pain: Secondary | ICD-10-CM | POA: Diagnosis not present

## 2023-09-16 DIAGNOSIS — M4802 Spinal stenosis, cervical region: Secondary | ICD-10-CM | POA: Diagnosis not present

## 2023-09-16 DIAGNOSIS — R001 Bradycardia, unspecified: Secondary | ICD-10-CM | POA: Diagnosis not present

## 2023-09-16 LAB — URINALYSIS, ROUTINE W REFLEX MICROSCOPIC
Bacteria, UA: NONE SEEN
Bilirubin Urine: NEGATIVE
Glucose, UA: NEGATIVE mg/dL
Hgb urine dipstick: NEGATIVE
Ketones, ur: NEGATIVE mg/dL
Leukocytes,Ua: NEGATIVE
Nitrite: NEGATIVE
Protein, ur: 30 mg/dL — AB
Specific Gravity, Urine: 1.024 (ref 1.005–1.030)
Squamous Epithelial / HPF: 0 /HPF (ref 0–5)
pH: 5 (ref 5.0–8.0)

## 2023-09-16 LAB — BASIC METABOLIC PANEL
Anion gap: 9 (ref 5–15)
BUN: 20 mg/dL (ref 8–23)
CO2: 26 mmol/L (ref 22–32)
Calcium: 9.3 mg/dL (ref 8.9–10.3)
Chloride: 104 mmol/L (ref 98–111)
Creatinine, Ser: 1.15 mg/dL (ref 0.61–1.24)
GFR, Estimated: >60 mL/min (ref >60)
Glucose, Bld: 103 mg/dL — ABNORMAL HIGH (ref 70–99)
Potassium: 3.5 mmol/L (ref 3.5–5.1)
Sodium: 139 mmol/L (ref 135–145)

## 2023-09-16 LAB — TROPONIN I (HIGH SENSITIVITY)
Troponin I (High Sensitivity): 6 ng/L (ref <18)
Troponin I (High Sensitivity): 5 ng/L (ref <18)

## 2023-09-16 LAB — HEPATIC FUNCTION PANEL
ALT: 33 U/L (ref 0–44)
AST: 38 U/L (ref 15–41)
Albumin: 4.4 g/dL (ref 3.5–5.0)
Alkaline Phosphatase: 50 U/L (ref 38–126)
Bilirubin, Direct: 0.1 mg/dL (ref 0.0–0.2)
Indirect Bilirubin: 0.6 mg/dL (ref 0.3–0.9)
Total Bilirubin: 0.7 mg/dL (ref 0.0–1.2)
Total Protein: 7.2 g/dL (ref 6.5–8.1)

## 2023-09-16 LAB — CBC
HCT: 44.3 % (ref 39.0–52.0)
Hemoglobin: 15.4 g/dL (ref 13.0–17.0)
MCH: 32 pg (ref 26.0–34.0)
MCHC: 34.8 g/dL (ref 30.0–36.0)
MCV: 92.1 fL (ref 80.0–100.0)
Platelets: 127 10*3/uL — ABNORMAL LOW (ref 150–400)
RBC: 4.81 MIL/uL (ref 4.22–5.81)
RDW: 11.9 % (ref 11.5–15.5)
WBC: 6.9 10*3/uL (ref 4.0–10.5)
nRBC: 0 % (ref 0.0–0.2)

## 2023-09-16 LAB — CBG MONITORING, ED: Glucose-Capillary: 93 mg/dL (ref 70–99)

## 2023-09-16 MED ORDER — TETANUS-DIPHTH-ACELL PERTUSSIS 5-2.5-18.5 LF-MCG/0.5 IM SUSY
0.5000 mL | PREFILLED_SYRINGE | Freq: Once | INTRAMUSCULAR | Status: AC
  Administered 2023-09-16: 0.5 mL via INTRAMUSCULAR
  Filled 2023-09-16 (×2): qty 0.5

## 2023-09-16 MED ORDER — LIDOCAINE-EPINEPHRINE (PF) 2 %-1:200000 IJ SOLN
20.0000 mL | Freq: Once | INTRAMUSCULAR | Status: AC
  Administered 2023-09-16: 20 mL via INTRADERMAL
  Filled 2023-09-16: qty 20

## 2023-09-16 MED ORDER — BACITRACIN ZINC 500 UNIT/GM EX OINT
TOPICAL_OINTMENT | Freq: Once | CUTANEOUS | Status: AC
  Administered 2023-09-16: 1 via TOPICAL
  Filled 2023-09-16: qty 0.9

## 2023-09-16 MED ORDER — CEPHALEXIN 500 MG PO CAPS: 500.0000 mg | ORAL_CAPSULE | Freq: Three times a day (TID) | ORAL | 0 refills | Status: AC

## 2023-09-16 MED ORDER — KETOROLAC TROMETHAMINE 30 MG/ML IJ SOLN
30.0000 mg | Freq: Once | INTRAMUSCULAR | Status: AC
  Administered 2023-09-16: 30 mg via INTRAVENOUS
  Filled 2023-09-16: qty 1

## 2023-09-16 MED ORDER — SODIUM CHLORIDE 0.9 % IV BOLUS (SEPSIS)
1000.0000 mL | Freq: Once | INTRAVENOUS | Status: AC
  Administered 2023-09-16: 1000 mL via INTRAVENOUS

## 2023-09-16 NOTE — ED Provider Notes (Signed)
 [facility] Provider Note    Event Date/Time   First MD Initiated Contact with Patient 09/16/23 0310     (approximate)   History   Loss of Consciousness and Laceration   HPI  Jose Khan is a 63 y.o. male with history of hyperlipidemia who presents to the emergency department after a syncopal event.  States he was in his normal state of health tonight and went to bed after drinking 3 vodka beverages.  States he had feelings of indigestion, heartburn and got up to go to the bathroom thinking he was going to have diarrhea.  States he stood up from the toilet and felt lightheaded and passed out and hit his head.  He is not on blood thinners.  Complaining of some right-sided neck and shoulder pain.  Unsure of his last tetanus vaccine.  Has a laceration to the right eyebrow.  Denies any shortness of breath, palpitations.  No vomiting, diarrhea.  No bloody stools or melena.  No history of PE, DVT, exogenous estrogen use, recent fractures, surgery, trauma, hospitalization, prolonged travel or other immobilization. No lower extremity swelling or pain. No calf tenderness.  History provided by patient, wife, EMS.    No past medical history on file.  Past Surgical History:  Procedure Laterality Date   COLONOSCOPY WITH PROPOFOL N/A 07/03/2018   Procedure: COLONOSCOPY WITH PROPOFOL;  Surgeon: Midge Minium, MD;  Location: [facility] ENDOSCOPY;  Service: Endoscopy;  Laterality: N/A;   ESOPHAGOGASTRODUODENOSCOPY (EGD) WITH PROPOFOL N/A 07/03/2018   Procedure: ESOPHAGOGASTRODUODENOSCOPY (EGD) WITH PROPOFOL;  Surgeon: Midge Minium, MD;  Location: [facility] ENDOSCOPY;  Service: Endoscopy;  Laterality: N/A;   KNEE ARTHROSCOPY Right     MEDICATIONS:  Prior to Admission medications   Medication Sig Start Date End Date Taking? Authorizing Provider  rosuvastatin (CRESTOR) 40 MG tablet Take 1 tablet (40 mg total) by mouth daily. 10/12/22   Jacky Kindle, FNP    Physical Exam   Triage  Vital Signs: ED Triage Vitals  Encounter Vitals Group     BP 09/16/23 0318 (!) 142/101     Systolic BP Percentile --      Diastolic BP Percentile --      Pulse Rate 09/16/23 0318 (!) 57     Resp 09/16/23 0318 18     Temp 09/16/23 0318 98.1 F (36.7 C)     Temp Source 09/16/23 0318 Oral     SpO2 09/16/23 0318 100 %     Weight 09/16/23 0309 213 lb (96.6 kg)     Height 09/16/23 0309 6\' 1"  (1.854 m)     Head Circumference --      Peak Flow --      Pain Score --      Pain Loc --      Pain Education --      Exclude from Growth Chart --      Most recent vital signs: Vitals:   09/16/23 0630 09/16/23 0700  BP: 119/85 112/69  Pulse: 71   Resp: 19 15  Temp:    SpO2: 98%      CONSTITUTIONAL: Alert, responds appropriately to questions. Well-appearing; well-nourished; GCS 15 HEAD: Normocephalic; patient has a 5 cm laceration to the right eyebrow EYES: Conjunctivae clear, PERRL, EOMI ENT: normal nose; no rhinorrhea; moist mucous membranes; pharynx without lesions noted; no dental injury; no septal hematoma, no epistaxis; no facial deformity or bony tenderness NECK: Supple, no midline spinal tenderness, step-off or deformity; trachea midline CARD: RRR; S1  and S2 appreciated; no murmurs, no clicks, no rubs, no gallops RESP: Normal chest excursion without splinting or tachypnea; breath sounds clear and equal bilaterally; no wheezes, no rhonchi, no rales; no hypoxia or respiratory distress CHEST:  chest wall stable, no crepitus or ecchymosis or deformity, nontender to palpation; no flail chest ABD/GI: Non-distended; soft, non-tender, no rebound, no guarding; no ecchymosis or other lesions noted PELVIS:  stable, nontender to palpation BACK:  The back appears normal; no midline spinal tenderness, step-off or deformity EXT: Normal ROM in all joints; no edema; normal capillary refill; no cyanosis, tender over the right lateral shoulder without bony deformity, loss of fullness of the shoulder  joint.  No joint effusions, compartments are soft, extremities are warm and well-perfused, no ecchymosis, no calf tenderness or calf swelling SKIN: Normal color for age and race; warm NEURO: No facial asymmetry, normal speech, moving all extremities equally  ED Results / Procedures / Treatments   LABS: (all labs ordered are listed, but only abnormal results are displayed) Labs Reviewed  BASIC METABOLIC PANEL - Abnormal; Notable for the following components:      Result Value   Glucose, Bld 103 (*)    All other components within normal limits  CBC - Abnormal; Notable for the following components:   Platelets 127 (*)    All other components within normal limits  URINALYSIS, ROUTINE W REFLEX MICROSCOPIC - Abnormal; Notable for the following components:   Color, Urine YELLOW (*)    APPearance CLEAR (*)    Protein, ur 30 (*)    All other components within normal limits  HEPATIC FUNCTION PANEL  CBG MONITORING, ED  TROPONIN I (HIGH SENSITIVITY)  TROPONIN I (HIGH SENSITIVITY)     EKG:  EKG Interpretation Date/Time:  Saturday September 16 2023 03:17:07 EDT Ventricular Rate:  48 PR Interval:  188 QRS Duration:  100 QT Interval:  472 QTC Calculation: 422 R Axis:   19  Text Interpretation: Sinus bradycardia RSR' in V1 or V2, right VCD or RVH Minimal ST elevation, anterior leads Confirmed by Rochele Raring [phone]) on 09/16/2023 3:23:50 AM          RADIOLOGY: My personal review and interpretation of imaging: Chest x-ray clear.  CT head, cervical spine and right shoulder x-ray show no traumatic injury.  I have personally reviewed all radiology reports. DG Chest Portable 1 View Result Date: 09/16/2023 CLINICAL DATA:  63 year old male with syncope and pain. EXAM: PORTABLE CHEST 1 VIEW COMPARISON:  Chest radiographs 06/15/2022. Cervical spine CT today. FINDINGS: Portable AP upright view at 0409 hours. Mildly lordotic positioning. Normal lung volumes and mediastinal contours. Visualized  tracheal air column is within normal limits. Allowing for portable technique the lungs are clear. No pneumothorax or pleural effusion. Negative visible bowel gas. No acute osseous abnormality identified. IMPRESSION: Negative portable chest. Electronically Signed   By: Odessa Fleming M.D.   On: 09/16/2023 04:39   DG Shoulder Right Portable Result Date: 09/16/2023 CLINICAL DATA:  63 year old male with syncope and pain. EXAM: RIGHT SHOULDER - 1 VIEW COMPARISON:  Chest radiographs 06/15/2022. FINDINGS: Three views at 0404 hours. Bone mineralization is within normal limits. No glenohumeral joint dislocation. Proximal right humerus intact. Right clavicle and scapula appear intact and aligned. Negative visible right ribs and lung. IMPRESSION: No acute fracture or dislocation identified about the right shoulder. Electronically Signed   By: Odessa Fleming M.D.   On: 09/16/2023 04:38   CT Cervical Spine Wo Contrast Result Date: 09/16/2023 CLINICAL DATA:  63 year old  male with syncope, loss of consciousness. Right eye laceration. EXAM: CT CERVICAL SPINE WITHOUT CONTRAST TECHNIQUE: Multidetector CT imaging of the cervical spine was performed without intravenous contrast. Multiplanar CT image reconstructions were also generated. RADIATION DOSE REDUCTION: This exam was performed according to the departmental dose-optimization program which includes automated exposure control, adjustment of the mA and/or kV according to patient size and/or use of iterative reconstruction technique. COMPARISON:  Head CT today. FINDINGS: Alignment: Straightening of cervical lordosis. Mild levoconvex cervical scoliosis. Cervicothoracic junction alignment is within normal limits. Bilateral posterior element alignment is within normal limits. Skull base and vertebrae: Bone mineralization is within normal limits. Visualized skull base is intact. No atlanto-occipital dissociation. C1 and C2 appear intact and aligned. No acute osseous abnormality identified. Soft  tissues and spinal canal: No prevertebral fluid or swelling. No visible canal hematoma. Negative visible noncontrast neck soft tissues. Disc levels: Generally age-appropriate cervical spine degeneration. Advanced disc and endplate degeneration at C6-C7. Mild spinal stenosis suspected there. Upper chest: Visible upper thoracic levels appear intact. Negative lung apices, noncontrast thoracic inlet. IMPRESSION: 1. No acute traumatic injury identified in the cervical spine. 2. Advanced disc and endplate degeneration at C6-C7. Electronically Signed   By: Odessa Fleming M.D.   On: 09/16/2023 04:10   CT HEAD WO CONTRAST ( ) Result Date: 09/16/2023 CLINICAL DATA:  63 year old male with syncope, loss of consciousness. Right eye laceration. EXAM: CT HEAD WITHOUT CONTRAST TECHNIQUE: Contiguous axial images were obtained from the base of the skull through the vertex without intravenous contrast. RADIATION DOSE REDUCTION: This exam was performed according to the departmental dose-optimization program which includes automated exposure control, adjustment of the mA and/or kV according to patient size and/or use of iterative reconstruction technique. COMPARISON:  None Available. FINDINGS: Brain: Normal cerebral volume. No midline shift, ventriculomegaly, mass effect, evidence of mass lesion, intracranial hemorrhage or evidence of cortically based acute infarction. Gray-white matter differentiation is within normal limits throughout the brain. Vascular: No suspicious intracranial vascular hyperdensity. Faint calcified atherosclerosis at the skull base. Skull: Are no fracture identified Sinuses/Orbits: Visualized paranasal sinuses and mastoids are stable and well aerated. Other: Broad-based right forehead, supraorbital scalp soft tissue injury with soft tissue deficiency (series 3, image 34) extending near the periosteum. Underlying right frontal bone and frontal sinus intact. Orbits soft tissues appears symmetric and within normal  limits. IMPRESSION: 1. Right forehead/supraorbital scalp soft tissue injury which extends deep toward the periosteum. No skull fracture. 2. Normal for age noncontrast CT appearance of the brain. Electronically Signed   By: Odessa Fleming M.D.   On: 09/16/2023 04:07     PROCEDURES:  Critical Care performed: No    Procedures    IMPRESSION / MDM / ASSESSMENT AND PLAN / ED COURSE  I reviewed the triage vital signs and the nursing notes.  Patient here after syncopal event when he stood up from the toilet.  The patient is on the cardiac monitor to evaluate for evidence of arrhythmia and/or significant heart rate changes.   DIFFERENTIAL DIAGNOSIS (includes but not limited to):   Orthostasis, dehydration from alcohol use, anemia, electrolyte derangement, ACS, arrhythmia, less likely PE, dissection  Patient's presentation is most consistent with acute presentation with potential threat to life or bodily function.  PLAN: Will obtain labs, EKG, chest x-ray.  Will give IV fluids and check orthostatic vital signs.  Will update his tetanus vaccine and clean and repair the wound to his eyebrow.  Will obtain CT head, cervical spine and x-ray of the right  shoulder.  I suspect  patient was slightly dehydrated from drinking alcohol last night and then became orthostatic when he stood up from the toilet causing him to pass out.   MEDICATIONS GIVEN IN ED: Medications  Tdap (BOOSTRIX) injection 0.5 mL (has no administration in time range)  lidocaine-EPINEPHrine (XYLOCAINE W/EPI) 2 %-1:200000 (PF) injection 20 mL (has no administration in time range)  ketorolac (TORADOL) 30 MG/ML injection 30 mg (has no administration in time range)  bacitracin ointment (has no administration in time range)  sodium chloride 0.9 % bolus 1,000 mL (0 mLs Intravenous Stopped 09/16/23 0417)     ED COURSE: Patient has a normal hemoglobin.  Normal electrolytes, glucose.  Troponin x 2 negative.  Urine shows no sign of infection.   Chest x-ray, shoulder x-ray, CT head and cervical spine reviewed and interpreted by myself and the radiologist and show no acute abnormality other than soft tissue injury consistent with his right eyebrow laceration.  Will clean, repair.  No evidence seen on cardiac monitoring.  Patient able to tolerate p.o. here.  Will ambulate.  Greig Right to repair laceration.  Please see her notes for further details.  Patient states he is feeling better.  Having some soreness in the right shoulder and a headache.  Will give Toradol.  No evidence seen on cardiac monitoring.  He is comfortable with plan for discharge home.  Recommended increase fluid intake, avoiding alcohol, discussed head injury supportive care instructions and return precautions.   At this time, I do not feel there is any life-threatening condition present. I reviewed all nursing notes, vitals, pertinent previous records.  All lab and urine results, EKGs, imaging ordered have been independently reviewed and interpreted by myself.  I reviewed all available radiology reports from any imaging ordered this visit.  Based on my assessment, I feel the patient is safe to be discharged home without further emergent workup and can continue workup as an outpatient as needed. Discussed all findings, treatment plan as well as usual and customary return precautions.  They verbalize understanding and are comfortable with this plan.  Outpatient follow-up has been provided as needed.  All questions have been answered.    CONSULTS:  none   OUTSIDE RECORDS REVIEWED: Reviewed last family medicine note on 10/12/2022.       FINAL CLINICAL IMPRESSION(S) / ED DIAGNOSES   Final diagnoses:  Syncope and collapse  Facial laceration, initial encounter  Atypical chest pain     Rx / DC Orders   ED Discharge Orders     None        Note:  This document was prepared using Dragon voice recognition software and may include unintentional dictation  errors.   Merly Hinkson, Layla Maw, DO 09/16/23 [phone]

## 2023-09-16 NOTE — Discharge Instructions (Addendum)
 You may alternate Tylenol 1000 mg every 6 hours as needed for pain, fever and Ibuprofen 800 mg every 6-8 hours as needed for pain, fever.  Please take Ibuprofen with food.  Do not take more than 4000 mg of Tylenol (acetaminophen) in a 24 hour period.  You have had a significant head injury.  CT of your head and cervical spine showed no acute traumatic injury other than the laceration to your right eyebrow.  Please avoid alcohol, sedatives for the next week.  Please rest and drink plenty of water.  We recommend that you avoid any activity that may lead to another head injury for at least 1 week or until your symptoms have completely resolved.  We also recommend "brain rest" to ensure the best possible long term outcomes - please avoid TV, cell phones, tablets, computers as much as possible for the next 48 hours.    I do recommend close follow-up with your primary care doctor.  I suspect that you passed out today secondary to being dehydrated from drinking alcohol and from orthostasis from standing up from the toilet causing decreased blood flow to your brain which caused you to pass out.  Your lab work including cardiac labs, EKG and chest x-ray were normal.  If you develop any chest discomfort, shortness of breath, palpitations, feel like you may pass out, have severe headache, vision changes, numbness or weakness on one side of your body, please return to the emergency department.

## 2023-09-16 NOTE — ED Provider Notes (Signed)
 LACERATION REPAIR Performed by: Faythe Ghee Authorized by: Faythe Ghee Consent: Verbal consent obtained. Risks and benefits: risks, benefits and alternatives were discussed Consent given by: patient Patient identity confirmed: provided demographic data Prepped and Draped in normal sterile fashion Wound explored  Laceration Location: right brow  Laceration Length: 10cm  No Foreign Bodies seen or palpated  Anesthesia: local infiltration  Local anesthetic: lidocaine 2% w epinephrine  Anesthetic total: 5 ml  Irrigation method: syringe Amount of cleaning: standard  Complex procedure multilayer  Skin closure: subcutaneous 5-0 vicryl, #8  Number of sutures: 17  Technique: simple sutures  Patient tolerance: Patient tolerated the procedure well with no immediate complications.   Suture care and after removal care discussed with the patient, pt placed on keflex for 5 days   Faythe Ghee, PA-C 09/16/23 0831    Ward, Layla Maw, DO 09/16/23 1002

## 2023-09-16 NOTE — ED Triage Notes (Signed)
 Patient arrives via EMS, C/O syncopal episode with LOC after getting up to attempt to have a bowel movement. Patient has laceration above right eye. No blood thinners. Denies any cardiac history.

## 2023-09-18 ENCOUNTER — Ambulatory Visit: Payer: Self-pay

## 2023-09-18 NOTE — Telephone Encounter (Signed)
 Copied from CRM [id number]. Topic: Clinical - Red Word Triage >> Sep 18, 2023  8:04 AM Payton Doughty wrote: Red Word that prompted transfer to Nurse Triage: pt passed out Friday night , fell and busted head open/25 stitches, needs fup  Chief Complaint: fall Symptoms: passed out then fell landed face down on floor, laceration to right eye Frequency: 09/15/2023 Pertinent Negatives: Patient denies n/v, dizziness Disposition: [] ED /[] Urgent Care (no appt availability in office) / [x] Appointment(In office/virtual)/ []  [facility]/ [] Home Care/ [] Refused Recommended Disposition /[] [facility]/ []  Follow-up with PCP Additional Notes: ground beef makes pt feels bad recently.  Had hamburger 09/15/2023 for lunch- later that day begin having: nausea, cold, sweaty, nausea then passed out and fell. Pt went to ED received 25 stiches to head above right eye: now has swelling to that area.  Scheduled pt for in-office visit for ED follow-up.  Reason for Disposition  [1] Fall AND [2] went to emergency department for evaluation or treatment  Answer Assessment - Initial Assessment Questions 1. MECHANISM: "How did the fall happen?"     Passed out prior to falling 2. DOMESTIC VIOLENCE AND ELDER ABUSE SCREENING: "Did you fall because someone pushed you or tried to hurt you?" If Yes, ask: "Are you safe now?"     no 3. ONSET: "When did the fall happen?" (e.g., minutes, hours, or days ago)     11/15/2023 4. LOCATION: "What part of the body hit the ground?" (e.g., back, buttocks, head, hips, knees, hands, head, stomach)     Head 25 stiches in front of head over right eye, right shoulder, and front of body 5. INJURY: "Did you hurt (injure) yourself when you fell?" If Yes, ask: "What did you injure? Tell me more about this?" (e.g., body area; type of injury; pain severity)"     yes 6. PAIN: "Is there any pain?" If Yes, ask: "How bad is the pain?" (e.g., Scale 1-10; or mild,  moderate, severe)   - NONE  (0): No pain   - MILD (1-3): Doesn't interfere with normal activities    - MODERATE (4-7): Interferes with normal activities or awakens from sleep    - SEVERE (8-10): Excruciating pain, unable to do any normal activities      1/10 7. SIZE: For cuts, bruises, or swelling, ask: "How large is it?" (e.g., inches or centimeters)      unknown 8. PREGNANCY: "Is there any chance you are pregnant?" "When was your last menstrual period?"     N/a 9. OTHER SYMPTOMS: "Do you have any other symptoms?" (e.g., dizziness, fever, weakness; new onset or worsening).      no 10. CAUSE: "What do you think caused the fall (or falling)?" (e.g., tripped, dizzy spell)       Passed out  Protocols used: Falls and [facility]

## 2023-09-20 NOTE — Progress Notes (Signed)
 Established patient visit  Patient: Jose Khan   DOB: 03-06-1961   63 y.o. Male  MRN: [id number] Visit Date: 09/21/2023  Today's healthcare provider: Debera Lat, PA-C   Chief Complaint  Patient presents with   Hospitalization Follow-up    F/u Head injury from fall.   Subjective      Discussed the use of AI scribe software for clinical note transcription with the patient, who gave verbal consent to proceed.  History of Present Illness The patient, with a recent history of a fall and head injury, presents for evaluation of episodes of discomfort and fainting associated with the consumption of hamburger meat. The episodes, which have occurred sporadically over the past six months, typically begin after midnight with the patient feeling hot, clammy, and sweaty, followed by nausea and diarrhea. The patient denies any problems with swallowing or choking. The most recent episode resulted in a fall and subsequent head injury, requiring stitches. The patient's brother has reportedly experienced similar episodes. The patient and his spouse have initiated a process of elimination, avoiding hamburger meat to see if the episodes persist.       09/21/2023   10:11 AM 10/12/2022    3:55 PM 05/04/2021    9:45 AM  Depression screen PHQ 2/9  Decreased Interest 0 0 0  Down, Depressed, Hopeless 0 0 0  PHQ - 2 Score 0 0 0  Altered sleeping 0 0 1  Tired, decreased energy 0 0 0  Change in appetite 0 0 0  Feeling bad or failure about yourself  0 0 0  Trouble concentrating 0 0 0  Moving slowly or fidgety/restless 0 0 0  Suicidal thoughts 0 0 0  PHQ-9 Score 0 0 1  Difficult doing work/chores  Not difficult at all Not difficult at all      09/21/2023   10:11 AM  GAD 7 : Generalized Anxiety Score  Nervous, Anxious, on Edge 0  Control/stop worrying 0  Worry too much - different things 0  Trouble relaxing 0  Restless 0  Easily annoyed or irritable 0  Afraid - awful might happen 0  Total GAD  7 Score 0  Anxiety Difficulty Not difficult at all    Medications: Outpatient Medications Prior to Visit  Medication Sig   cephALEXin (KEFLEX) 500 MG capsule Take 1 capsule (500 mg total) by mouth 3 (three) times daily for 5 days.   rosuvastatin (CRESTOR) 40 MG tablet Take 1 tablet (40 mg total) by mouth daily.   No facility-administered medications prior to visit.    Review of Systems All negative Except see HPI       Objective    BP 128/78 (BP Location: Left Arm, Patient Position: Sitting)   Pulse 67   Temp 98.1 F (36.7 C) (Oral)   Resp 16   Ht 6' (1.829 m)   Wt 219 lb (99.3 kg)   SpO2 98%   BMI 29.70 kg/m     Physical Exam Constitutional:      General: He is not in acute distress.    Appearance: Normal appearance. He is not diaphoretic.  HENT:     Head: Normocephalic.  Eyes:     Conjunctiva/sclera: Conjunctivae normal.  Pulmonary:     Effort: Pulmonary effort is normal. No respiratory distress.  Neurological:     Mental Status: He is alert and oriented to person, place, and time. Mental status is at baseline.      No results found for any visits on 09/21/23.  Assessment & Plan Head laceration Laceration required stitches, due for removal on day five post-injury. - Advised to remove stitches using a suture removal kit and saline solution in a day or two, per ED recommendation.  Adverse reaction to hamburger meat Episodes of sweating, clamminess, nausea, and diarrhea after consuming hamburger meat. Possible hereditary component suggested by similar episodes in brother. Could be due to alpha-gal syndrome - Advise to avoid consuming hamburger meat and advised to avoid further tick bites to prevent exacerbation. - Monitor for recurrence of symptoms and consider further evaluation if symptoms persist/checking for serum alpha-gal IgE with possible prescription of epinephrine in a case of severe allergic reaction/anaphylactic reaction   No orders of the  defined types were placed in this encounter.   No follow-ups on file.   The patient was advised to call back or seek an in-person evaluation if the symptoms worsen or if the condition fails to improve as anticipated.  I discussed the assessment and treatment plan with the patient. The patient was provided an opportunity to ask questions and all were answered. The patient agreed with the plan and demonstrated an understanding of the instructions.  I, Debera Lat, PA-C have reviewed all documentation for this visit. The documentation on 09/21/2023  for the exam, diagnosis, procedures, and orders are all accurate and complete.  Debera Lat, [facility], MMS [facility] [phone] (phone) [phone] (fax)  [facility]

## 2023-09-21 ENCOUNTER — Ambulatory Visit: Admitting: Physician Assistant

## 2023-09-21 ENCOUNTER — Encounter: Payer: Self-pay | Admitting: Physician Assistant

## 2023-09-21 VITALS — BP 128/78 | HR 67 | Temp 98.1°F | Resp 16 | Ht 72.0 in | Wt 219.0 lb

## 2023-09-21 DIAGNOSIS — T781XXA Other adverse food reactions, not elsewhere classified, initial encounter: Secondary | ICD-10-CM

## 2023-09-21 DIAGNOSIS — S0181XD Laceration without foreign body of other part of head, subsequent encounter: Secondary | ICD-10-CM | POA: Diagnosis not present

## 2023-10-13 ENCOUNTER — Encounter: Payer: 59 | Admitting: Family Medicine

## 2023-10-20 ENCOUNTER — Telehealth: Payer: Self-pay | Admitting: Family Medicine

## 2023-10-20 DIAGNOSIS — E785 Hyperlipidemia, unspecified: Secondary | ICD-10-CM

## 2023-10-20 MED ORDER — ROSUVASTATIN CALCIUM 40 MG PO TABS
40.0000 mg | ORAL_TABLET | Freq: Every day | ORAL | 3 refills | Status: AC
Start: 2023-10-20 — End: ?

## 2023-10-20 NOTE — Telephone Encounter (Signed)
 [facility] is requesting refill rosuvastatin  (CRESTOR ) 40 MG tablet  Please advise

## 2023-11-21 ENCOUNTER — Encounter: Payer: Self-pay | Admitting: Family Medicine

## 2023-12-13 ENCOUNTER — Encounter: Payer: Self-pay | Admitting: Family Medicine

## 2023-12-13 ENCOUNTER — Ambulatory Visit (INDEPENDENT_AMBULATORY_CARE_PROVIDER_SITE_OTHER): Admitting: Family Medicine

## 2023-12-13 VITALS — BP 137/86 | HR 51 | Ht 72.0 in | Wt 209.6 lb

## 2023-12-13 DIAGNOSIS — Z91014 Allergy to mammalian meats: Secondary | ICD-10-CM

## 2023-12-13 DIAGNOSIS — E785 Hyperlipidemia, unspecified: Secondary | ICD-10-CM

## 2023-12-13 DIAGNOSIS — R030 Elevated blood-pressure reading, without diagnosis of hypertension: Secondary | ICD-10-CM

## 2023-12-13 DIAGNOSIS — Z125 Encounter for screening for malignant neoplasm of prostate: Secondary | ICD-10-CM

## 2023-12-13 DIAGNOSIS — Z1211 Encounter for screening for malignant neoplasm of colon: Secondary | ICD-10-CM

## 2023-12-13 DIAGNOSIS — Z0000 Encounter for general adult medical examination without abnormal findings: Secondary | ICD-10-CM

## 2023-12-13 NOTE — Progress Notes (Signed)
 Complete physical exam  Patient: Jose Khan   DOB: 31-May-1961   63 y.o. Male  MRN: [id number]  Subjective:    Chief Complaint  Patient presents with   Annual Exam    Last completed 10/12/22 Diet -  General well balanced Exercise - 3 to 4 times a week for 1 hr Feeling - well Sleeping - fairly well due to having a hard time going to sleep sometimes Concerns -  none   Care Management    Zoster Vaccines - declined Colonoscopy - ok to place referral   Discussed the use of AI scribe software for clinical note transcription with the patient, who gave verbal consent to proceed.  History of Present Illness Jose Khan is a 63 year old male who presents for an annual physical exam and evaluation of a possible allergy to ground beef.  He has experienced episodes of waking up cold, sweaty, clammy, and nauseous with diarrhea after consuming ground beef on three separate occasions. These symptoms were severe enough to result in a fall during one episode, leading to a head injury requiring 25 stitches in April 2025. He has since avoided ground beef and has not experienced further episodes. He has consumed other types of beef without issue. No recent falls, throat closure, or tick exposures that he is aware of.  He takes 40 mg of Rosuvastatin  daily for cholesterol management and receives monthly refills. He has a history of colon polyps, with a recommendation for colonoscopy every five years.   He enjoys running, and attends running group 3-4x per week. He is retired, previously Health and safety inspector, and remains active with community activities such as [employer] school meetings and audio work for events.  Most recent fall risk assessment:    10/12/2022    3:55 PM  Fall Risk   Falls in the past year? 0  Number falls in past yr: 0  Injury with Fall? 0  Risk for fall due to : No Fall Risks     Most recent depression screenings:    09/21/2023   10:11 AM 10/12/2022    3:55 PM  PHQ 2/9  Scores  PHQ - 2 Score 0 0  PHQ- 9 Score 0 0      09/21/2023   10:11 AM  GAD 7 : Generalized Anxiety Score  Nervous, Anxious, on Edge 0  Control/stop worrying 0  Worry too much - different things 0  Trouble relaxing 0  Restless 0  Easily annoyed or irritable 0  Afraid - awful might happen 0  Total GAD 7 Score 0  Anxiety Difficulty Not difficult at all     Vision:Within last year, Dental: No current dental problems and Receives regular dental care, and PSA: Prostate cancer screening and PSA options (with potential risks and benefits of testing vs. not testing) were discussed along with recent recs/guidelines.   Patient Active Problem List   Diagnosis Date Noted   Annual physical exam 10/12/2022   Screening for prostate cancer 10/12/2022   Hyperlipidemia 05/04/2021   Hx of colonic polyps    Polyp of sigmoid colon    Stricture and stenosis of esophagus    Tubular adenoma of colon 04/27/2017   History reviewed. No pertinent past medical history. Past Surgical History:  Procedure Laterality Date   COLONOSCOPY WITH PROPOFOL  N/A 07/03/2018   Procedure: COLONOSCOPY WITH PROPOFOL ;  Surgeon: Marnee Sink, MD;  Location: [facility];  Service: Endoscopy;  Laterality: N/A;   ESOPHAGOGASTRODUODENOSCOPY (EGD) WITH PROPOFOL   N/A 07/03/2018   Procedure: ESOPHAGOGASTRODUODENOSCOPY (EGD) WITH PROPOFOL ;  Surgeon: Marnee Sink, MD;  Location: [facility] ENDOSCOPY;  Service: Endoscopy;  Laterality: N/A;   KNEE ARTHROSCOPY Right    Social History   Tobacco Use   Smoking status: Never   Smokeless tobacco: Never  Vaping Use   Vaping status: Never Used  Substance Use Topics   Alcohol use: Yes    Alcohol/week: 4.0 standard drinks of alcohol    Types: 1 Glasses of wine, 1 Cans of beer, 2 Shots of liquor per week   Drug use: No   No Known Allergies    Patient Care Team: Tasia Farr, FNP as PCP - General (Family Medicine)   Outpatient Medications Prior to Visit  Medication Sig    rosuvastatin  (CRESTOR ) 40 MG tablet Take 1 tablet (40 mg total) by mouth daily.   No facility-administered medications prior to visit.    Review of Systems  All other systems reviewed and are negative.         Objective:     BP 137/86 (BP Location: Left Arm, Patient Position: Sitting, Cuff Size: Normal)   Pulse (!) 51   Ht 6' (1.829 m)   Wt 209 lb 9.6 oz (95.1 kg)   SpO2 97%   BMI 28.43 kg/m    Physical Exam Vitals reviewed.  Constitutional:      General: He is not in acute distress.    Appearance: Normal appearance. He is normal weight. He is not ill-appearing.  HENT:     Head: Normocephalic.     Right Ear: Tympanic membrane normal.     Left Ear: Tympanic membrane normal.     Nose: Nose normal.     Mouth/Throat:     Mouth: Mucous membranes are moist.     Pharynx: Oropharynx is clear. No oropharyngeal exudate or posterior oropharyngeal erythema.   Eyes:     General: Lids are normal. No visual field deficit.       Right eye: No discharge.        Left eye: No discharge.     Extraocular Movements: Extraocular movements intact.     Right eye: Normal extraocular motion.     Left eye: Normal extraocular motion.     Conjunctiva/sclera: Conjunctivae normal.     Right eye: Right conjunctiva is not injected.     Left eye: Left conjunctiva is not injected.     Pupils: Pupils are equal, round, and reactive to light.   Neck:     Thyroid: No thyroid mass, thyromegaly or thyroid tenderness.     Vascular: No carotid bruit.   Cardiovascular:     Rate and Rhythm: Normal rate and regular rhythm.     Pulses: Normal pulses.          Radial pulses are 2+ on the right side and 2+ on the left side.       Posterior tibial pulses are 2+ on the right side and 2+ on the left side.     Heart sounds: Normal heart sounds, S1 normal and S2 normal. No murmur heard.    No friction rub. No gallop.  Pulmonary:     Effort: Pulmonary effort is normal. No respiratory distress.     Breath  sounds: Normal breath sounds. No stridor. No wheezing, rhonchi or rales.  Abdominal:     General: Bowel sounds are normal. There is no distension.     Palpations: Abdomen is soft. There is no mass.  Tenderness: There is no abdominal tenderness. There is no right CVA tenderness, left CVA tenderness, guarding or rebound.     Hernia: No hernia is present.  Genitourinary:    Comments: Not assessed, no concerns during visit  Musculoskeletal:        General: No swelling or tenderness. Normal range of motion.     Cervical back: Normal range of motion. No rigidity.  Lymphadenopathy:     Cervical: No cervical adenopathy.     Right cervical: No superficial, deep or posterior cervical adenopathy.    Left cervical: No superficial, deep or posterior cervical adenopathy.   Skin:    General: Skin is warm and dry.     Capillary Refill: Capillary refill takes less than 2 seconds.     Findings: No bruising or erythema.   Neurological:     General: No focal deficit present.     Mental Status: He is alert and oriented to person, place, and time. Mental status is at baseline.     GCS: GCS eye subscore is 4. GCS verbal subscore is 5. GCS motor subscore is 6.     Cranial Nerves: Cranial nerves 2-12 are intact. No cranial nerve deficit, dysarthria or facial asymmetry.     Sensory: No sensory deficit.     Motor: No weakness, tremor or pronator drift.     Coordination: Romberg sign negative.     Gait: Gait is intact. Gait normal.   Psychiatric:        Attention and Perception: Attention and perception normal.        Mood and Affect: Mood and affect normal.        Speech: Speech normal.        Behavior: Behavior normal. Behavior is cooperative.        Thought Content: Thought content normal.        Cognition and Memory: Cognition and memory normal.        Judgment: Judgment normal.      No results found for any visits on 12/13/23.     Assessment & Plan:    Routine Health Maintenance and  Physical Exam  Health Maintenance  Topic Date Due   Zoster (Shingles) Vaccine (1 of 2) Never done   COVID-19 Vaccine (2 - 2024-25 season) 02/26/2023   Colon Cancer Screening  07/04/2023   Flu Shot  01/26/2024   DTaP/Tdap/Td vaccine (4 - Td or Tdap) 09/15/2033   Hepatitis C Screening  Completed   HIV Screening  Completed   HPV Vaccine  Aged Out   Meningitis B Vaccine  Aged Out    Assessment and Plan Assessment & Plan Suspected potential Beef Allergy due to systemic symptoms post ground beef ingestion (lightheadedness, sweaty, fall, NVD) R/O alpha-gal syndrome.  No known tick exposure or anaphylactic symptoms.  Discussed testing and potential allergist referral. - Order alpha-gal panel blood test. - Consider referral to an allergist if alpha-gal test is positive.  Hyperlipidemia Stable on Rosuvastatin  40 mg daily. - Continue Rosuvastatin  40 mg daily. - Order lipid panel.  General Health Maintenance - Order colonoscopy. - Perform annual physicals. - Order PSA test. - Order complete blood count and electrolyte and kidney function tests.  Things to do to keep yourself healthy  - Exercise at least 30-45 minutes a day, 3-4 days a week.  - Eat a low-fat diet with lots of fruits and vegetables, up to 7-9 servings per day.  - Seatbelts can save your life. Wear them always.  - Smoke  detectors on every level of your home, check batteries every year.  - Eye Doctor - have an eye exam every 1-2 years  - Safe sex - if you may be exposed to STDs, use a condom.  - Alcohol -  If you drink, do it moderately, less than 2 drinks per day.  - Health Care Power of Attorney. Choose someone to speak for you if you are not able.  - Depression is common in our stressful world.If you're feeling down or losing interest in things you normally enjoy, please come in for a visit.  - Violence - If anyone is threatening or hurting you, please call immediately.    Discussed health benefits of physical  activity, and encouraged him to engage in regular exercise appropriate for his age and condition.  Hyperlipidemia, unspecified hyperlipidemia type -     Lipid Panel With LDL/HDL Ratio  Annual physical exam -     Comprehensive metabolic panel with GFR -     CBC with Differential/Platelet  Screening for prostate cancer -     PSA  Allergy to beef -     Alpha-Gal Panel  Screening for colon cancer -     Ambulatory referral to Gastroenterology  Elevated blood pressure reading in office without diagnosis of hypertension  GOAL<120/80 Water drink of choice Monitor at home with upper arm automatic cuff Limit sodium intake Continue exercise regimen.  Return for annual physical.     Tasia Farr, FNP  I, Tasia Farr, FNP, have reviewed all documentation for this visit. The documentation on 12/13/23 for the exam, diagnosis, procedures, and orders are all accurate and complete.

## 2023-12-13 NOTE — Patient Instructions (Signed)
 Thank you for allowing us  to assist you with proper care. A referral has been placed on your behalf. Our referral coordination team or the office you will be visiting will contact you within the next 10 business days. If you have not received a phone call within 10 business days please let us  know so that we can check into this for you.   [facility]

## 2023-12-14 ENCOUNTER — Other Ambulatory Visit: Payer: Self-pay

## 2023-12-14 ENCOUNTER — Telehealth: Payer: Self-pay

## 2023-12-14 ENCOUNTER — Ambulatory Visit: Payer: Self-pay | Admitting: Family Medicine

## 2023-12-14 DIAGNOSIS — Z860100 Personal history of colon polyps, unspecified: Secondary | ICD-10-CM

## 2023-12-14 LAB — CBC WITH DIFFERENTIAL/PLATELET
Basophils Absolute: 0 10*3/uL (ref 0.0–0.2)
Eos: 2 %
Immature Grans (Abs): 0 10*3/uL (ref 0.0–0.1)
Immature Granulocytes: 0 %
Lymphs: 50 %
Monocytes: 8 %
Neutrophils: 40 %
RBC: 4.83 x10E6/uL (ref 4.14–5.80)
RDW: 12.5 % (ref 11.6–15.4)

## 2023-12-14 LAB — COMPREHENSIVE METABOLIC PANEL WITH GFR
ALT: 22 IU/L (ref 0–44)
Albumin: 4.5 g/dL (ref 3.9–4.9)
Alkaline Phosphatase: 103 IU/L (ref 44–121)
BUN/Creatinine Ratio: 9 — ABNORMAL LOW (ref 10–24)
Calcium: 9.6 mg/dL (ref 8.6–10.2)
Chloride: 100 mmol/L (ref 96–106)
Glucose: 92 mg/dL (ref 70–99)
Potassium: 4.4 mmol/L (ref 3.5–5.2)
Sodium: 140 mmol/L (ref 134–144)
eGFR: 76 mL/min/{1.73_m2} (ref >59)

## 2023-12-14 LAB — PSA: Prostate Specific Ag, Serum: 0.6 ng/mL (ref 0.0–4.0)

## 2023-12-14 LAB — LIPID PANEL WITH LDL/HDL RATIO
Cholesterol, Total: 160 mg/dL (ref 100–199)
HDL: 49 mg/dL (ref >39)
LDL Chol Calc (NIH): 97 mg/dL (ref 0–99)
LDL/HDL Ratio: 2 ratio (ref 0.0–3.6)

## 2023-12-14 MED ORDER — NA SULFATE-K SULFATE-MG SULF 17.5-3.13-1.6 GM/177ML PO SOLN: 1.0000 | Freq: Once | ORAL | 0 refills | Status: AC

## 2023-12-14 NOTE — Telephone Encounter (Signed)
 Gastroenterology Pre-Procedure Review  Request Date: 02/15/24 Requesting Physician: Dr. Ole Berkeley  PATIENT REVIEW QUESTIONS: The patient responded to the following health history questions as indicated:    1. Are you having any GI issues? no 2. Do you have a personal history of Polyps? yes (last colonoscopy performed by Dr. Ole Berkeley 07/16/18 recommended repeat in 5 years) 3. Do you have a family history of Colon Cancer or Polyps? no 4. Diabetes Mellitus? no 5. Joint replacements in the past 12 months?no 6. Major health problems in the past 3 months?no 7. Any artificial heart valves, MVP, or defibrillator?no    MEDICATIONS & ALLERGIES:    Patient reports the following regarding taking any anticoagulation/antiplatelet therapy:   Plavix, Coumadin, Eliquis, Xarelto, Lovenox, Pradaxa, Brilinta, or Effient? no Aspirin? no  Patient confirms/reports the following medications:  Current Outpatient Medications  Medication Sig Dispense Refill   rosuvastatin  (CRESTOR ) 40 MG tablet Take 1 tablet (40 mg total) by mouth daily. 90 tablet 3   No current facility-administered medications for this visit.    Patient confirms/reports the following allergies:  No Known Allergies  No orders of the defined types were placed in this encounter.   AUTHORIZATION INFORMATION Primary Insurance: 1D#: Group #:  Secondary Insurance: 1D#: Group #:  SCHEDULE INFORMATION: Date: 02/15/24 Time: Location: [facility]

## 2023-12-15 NOTE — Telephone Encounter (Signed)
Please see the message below and advise.

## 2023-12-16 LAB — ALPHA-GAL PANEL: IgE (Immunoglobulin E), Serum: 17 [IU]/mL (ref 6–495)

## 2023-12-16 LAB — COMPREHENSIVE METABOLIC PANEL WITH GFR
AST: 27 IU/L (ref 0–40)
BUN: 10 mg/dL (ref 8–27)
Bilirubin Total: 0.7 mg/dL (ref 0.0–1.2)
CO2: 23 mmol/L (ref 20–29)
Creatinine, Ser: 1.1 mg/dL (ref 0.76–1.27)
Globulin, Total: 2.6 g/dL (ref 1.5–4.5)
Total Protein: 7.1 g/dL (ref 6.0–8.5)

## 2023-12-16 LAB — LIPID PANEL WITH LDL/HDL RATIO
Triglycerides: 72 mg/dL (ref 0–149)
VLDL Cholesterol Cal: 14 mg/dL (ref 5–40)

## 2023-12-16 LAB — CBC WITH DIFFERENTIAL/PLATELET
Basos: 0 %
EOS (ABSOLUTE): 0.1 10*3/uL (ref 0.0–0.4)
Hematocrit: 46 % (ref 37.5–51.0)
Hemoglobin: 15.4 g/dL (ref 13.0–17.7)
Lymphocytes Absolute: 2.2 10*3/uL (ref 0.7–3.1)
MCH: 31.9 pg (ref 26.6–33.0)
MCHC: 33.5 g/dL (ref 31.5–35.7)
MCV: 95 fL (ref 79–97)
Monocytes Absolute: 0.4 10*3/uL (ref 0.1–0.9)
Neutrophils Absolute: 1.8 10*3/uL (ref 1.4–7.0)
Platelets: 115 10*3/uL — ABNORMAL LOW (ref 150–450)
WBC: 4.6 10*3/uL (ref 3.4–10.8)

## 2023-12-17 ENCOUNTER — Encounter: Payer: Self-pay | Admitting: Family Medicine

## 2023-12-19 ENCOUNTER — Telehealth: Payer: Self-pay

## 2023-12-19 ENCOUNTER — Encounter: Payer: Self-pay | Admitting: Family Medicine

## 2023-12-19 MED ORDER — AMOXICILLIN-POT CLAVULANATE 875-125 MG PO TABS: 1.0000 | ORAL_TABLET | Freq: Two times a day (BID) | ORAL | 0 refills | Status: AC

## 2023-12-19 NOTE — Telephone Encounter (Signed)
 Copied from CRM [id number]. Topic: Clinical - Prescription Issue >> Dec 19, 2023 11:00 AM Zebedee SAUNDERS wrote: Reason for CRM: Pt requesting antibiotics for sinus infection. [facility] - [place], [place] - [address] [place] [address] Phone: [phone] Fax: [phone].

## 2024-02-01 ENCOUNTER — Telehealth: Payer: Self-pay

## 2024-02-01 NOTE — Telephone Encounter (Signed)
 Pt stated that due to a scheduling conflict, he would like to cancel procedure and call back to reschedule another time

## 2024-02-15 ENCOUNTER — Encounter: Admission: RE | Payer: Self-pay | Source: Home / Self Care

## 2024-02-15 ENCOUNTER — Ambulatory Visit: Admission: RE | Admit: 2024-02-15 | Source: Home / Self Care | Admitting: Gastroenterology

## 2024-02-15 SURGERY — COLONOSCOPY
Anesthesia: General

## 2024-06-07 DIAGNOSIS — L570 Actinic keratosis: Secondary | ICD-10-CM | POA: Diagnosis not present

## 2024-06-07 DIAGNOSIS — D1801 Hemangioma of skin and subcutaneous tissue: Secondary | ICD-10-CM | POA: Diagnosis not present

## 2024-06-07 DIAGNOSIS — D485 Neoplasm of uncertain behavior of skin: Secondary | ICD-10-CM | POA: Diagnosis not present

## 2024-06-07 DIAGNOSIS — L821 Other seborrheic keratosis: Secondary | ICD-10-CM | POA: Diagnosis not present
